# Patient Record
Sex: Female | Born: 1960 | Race: White | Hispanic: No | Marital: Single | Smoking: Current every day smoker
Health system: Southern US, Community
[De-identification: ages and names within clinical notes are randomized; demographics above are authoritative.]

## PROBLEM LIST (undated history)

## (undated) DIAGNOSIS — J449 Chronic obstructive pulmonary disease, unspecified: Secondary | ICD-10-CM

## (undated) DIAGNOSIS — I890 Lymphedema, not elsewhere classified: Secondary | ICD-10-CM

---

## 2016-08-03 ENCOUNTER — Emergency Department (HOSPITAL_COMMUNITY)
Admission: EM | Admit: 2016-08-03 | Discharge: 2016-08-04 | Disposition: A | Discharge status: Home / Self Care | Payer: Self-pay | Attending: Emergency Medicine | Admitting: Emergency Medicine

## 2016-08-03 ENCOUNTER — Encounter (HOSPITAL_COMMUNITY): Payer: Self-pay | Admitting: Emergency Medicine

## 2016-08-03 ENCOUNTER — Emergency Department (HOSPITAL_COMMUNITY): Payer: Self-pay

## 2016-08-03 DIAGNOSIS — Y939 Activity, unspecified: Secondary | ICD-10-CM | POA: Insufficient documentation

## 2016-08-03 DIAGNOSIS — Y999 Unspecified external cause status: Secondary | ICD-10-CM | POA: Insufficient documentation

## 2016-08-03 DIAGNOSIS — S52502A Unspecified fracture of the lower end of left radius, initial encounter for closed fracture: Secondary | ICD-10-CM | POA: Insufficient documentation

## 2016-08-03 DIAGNOSIS — Y92009 Unspecified place in unspecified non-institutional (private) residence as the place of occurrence of the external cause: Secondary | ICD-10-CM | POA: Insufficient documentation

## 2016-08-03 DIAGNOSIS — S52612A Displaced fracture of left ulna styloid process, initial encounter for closed fracture: Secondary | ICD-10-CM | POA: Insufficient documentation

## 2016-08-03 DIAGNOSIS — W010XXA Fall on same level from slipping, tripping and stumbling without subsequent striking against object, initial encounter: Secondary | ICD-10-CM | POA: Insufficient documentation

## 2016-08-03 DIAGNOSIS — F17200 Nicotine dependence, unspecified, uncomplicated: Secondary | ICD-10-CM | POA: Insufficient documentation

## 2016-08-03 DIAGNOSIS — S52602A Unspecified fracture of lower end of left ulna, initial encounter for closed fracture: Secondary | ICD-10-CM

## 2016-08-03 MED ORDER — HYDROMORPHONE HCL 1 MG/ML IJ SOLN
1.0000 mg | Freq: Once | INTRAMUSCULAR | Status: AC
  Administered 2016-08-03: 1 mg via INTRAVENOUS
  Filled 2016-08-03: qty 1

## 2016-08-03 MED ORDER — NICOTINE 14 MG/24HR TD PT24
14.0000 mg | MEDICATED_PATCH | Freq: Once | TRANSDERMAL | Status: DC
Start: 2016-08-03 — End: 2016-08-04
  Administered 2016-08-03: 14 mg via TRANSDERMAL
  Filled 2016-08-03: qty 1

## 2016-08-03 NOTE — ED Triage Notes (Signed)
Pt tripped over toy and injured left wrist, obvious deformity.

## 2016-08-03 NOTE — ED Provider Notes (Signed)
[facility] DEPT Provider Note   CSN: [id number] Arrival date & time: 08/03/16  2217     History   Chief Complaint Chief Complaint  Patient presents with  . Wrist Pain    HPI Margaret Weber is a 56 y.o. female.  Patient reports a mechanical fall at home tripping over a cat Toy injuring her left wrist. States she fell off to the side and landed on her left wrist. She was not able to get up and laid on the floor for 3 hours until her husband got home. She denies any head or neck pain. She denies any back pain. She denies any other injury other than her wrist. No other medical problems. Does not take any medications. No focal weakness, numbness or tingling.   The history is provided by the patient.  Wrist Pain  Pertinent negatives include no chest pain, no abdominal pain, no headaches and no shortness of breath.    History reviewed. No pertinent past medical history.  There are no active problems to display for this patient.   History reviewed. No pertinent surgical history.  OB History    No data available       Home Medications    Prior to Admission medications   Not on File    Family History History reviewed. No pertinent family history.  Social History Social History  Substance Use Topics  . Smoking status: Current Every Day Smoker  . Smokeless tobacco: Never Used  . Alcohol use No     Allergies   Patient has no allergy information on record.   Review of Systems Review of Systems  Constitutional: Negative for activity change, appetite change and fever.  HENT: Negative for congestion.   Respiratory: Negative for cough, chest tightness and shortness of breath.   Cardiovascular: Negative for chest pain.  Gastrointestinal: Negative for abdominal pain, nausea and vomiting.  Genitourinary: Negative for dysuria, hematuria, vaginal bleeding and vaginal discharge.  Musculoskeletal: Positive for arthralgias and myalgias. Negative for back pain and neck  pain.  Neurological: Negative for dizziness, weakness and headaches.   all other systems are negative except as noted in the HPI and PMH.    Physical Exam Updated Vital Signs BP (!) 174/95   Pulse (!) 115   Temp 98.1 F (36.7 C)   Resp 20   Ht 5\' 4"  (1.626 m)   Wt 90.7 kg (200 lb)   SpO2 94%   BMI 34.33 kg/m   Physical Exam  Constitutional: She is oriented to person, place, and time. She appears well-developed and well-nourished. No distress.  HENT:  Head: Normocephalic and atraumatic.  Mouth/Throat: Oropharynx is clear and moist. No oropharyngeal exudate.  Eyes: Conjunctivae and EOM are normal. Pupils are equal, round, and reactive to light.  Neck: Normal range of motion. Neck supple.  No C-spine tenderness  Cardiovascular: Normal rate, regular rhythm, normal heart sounds and intact distal pulses.   No murmur heard. Pulmonary/Chest: Effort normal and breath sounds normal. No respiratory distress.  Abdominal: Soft. There is no tenderness. There is no rebound and no guarding.  Musculoskeletal: Normal range of motion. She exhibits edema, tenderness and deformity.  Deformity and swelling to left wrist. Intact distal pulse. Range of motion of fingers intact, wounds, full range of motion of shoulder and elbow  Neurological: She is alert and oriented to person, place, and time. No cranial nerve deficit. She exhibits normal muscle tone. Coordination normal.   5/5 strength throughout. CN 2-12 intact.Equal grip strength.  Skin: Skin is warm.  Psychiatric: She has a normal mood and affect. Her behavior is normal.  Nursing note and vitals reviewed.    ED Treatments / Results  Labs (all labs ordered are listed, but only abnormal results are displayed) Labs Reviewed - No data to display  EKG  EKG Interpretation None       Radiology Dg Wrist Complete Left  Result Date: 08/04/2016 CLINICAL DATA:  Fracture, postreduction. EXAM: LEFT WRIST - COMPLETE 3+ VIEW COMPARISON:  Pre  reduction exam earlier this day. FINDINGS: Improved alignment of comminuted angulated distal radius fracture with mild persistent apex volar angulation. Unchanged alignment of ulna styloid fracture. No new abnormality is seen. Overlying splint material limits osseous and soft tissue fine detail. IMPRESSION: Improved alignment of comminuted angulated distal radius fracture postreduction. Unchanged ulna styloid fracture. Electronically Signed   By: Rubye Oaks M.D.   On: 08/04/2016 03:23   Dg Wrist Complete Left  Result Date: 08/04/2016 CLINICAL DATA:  Trip over a toy and fall. Left wrist pain and deformity. EXAM: LEFT WRIST - COMPLETE 3+ VIEW COMPARISON:  None. FINDINGS: Displaced angulated fracture of the distal radial metaphysis with apex volar angulation. Fracture extends into the distal radioulnar and radiocarpal joints. Displaced ulna styloid fracture. Carpals remain aligned with dominant distal radial fracture fragment. Diffuse soft tissue edema. IMPRESSION: Displaced angulated distal radial fracture extending into the radiocarpal and distal radioulnar joints. Displaced ulna styloid fracture. Electronically Signed   By: Rubye Oaks M.D.   On: 08/04/2016 01:14    Procedures .Foreign Body Removal Date/Time: 08/04/2016 12:30 AM Performed by: Glynn Octave Authorized by: Glynn Octave  Consent: The procedure was performed in an emergent situation. Verbal consent obtained. Risks and benefits: risks, benefits and alternatives were discussed Consent given by: patient Patient identity confirmed: verbally with patient Time out: Immediately prior to procedure a "time out" was called to verify the correct patient, procedure, equipment, support staff and site/side marked as required. Body area: skin General location: upper extremity Location details: left ring finger  Sedation: Patient sedated: no Patient restrained: no Patient cooperative: yes Depth: subcutaneous Complexity:  simple 1 objects recovered. Objects recovered: ring Post-procedure assessment: foreign body removed Patient tolerance: Patient tolerated the procedure well with no immediate complications Reduction of fracture Date/Time: 08/04/2016 2:33 AM Performed by: Glynn Octave Authorized by: Glynn Octave  Consent: Written consent obtained. Risks and benefits: risks, benefits and alternatives were discussed Consent given by: patient Patient understanding: patient states understanding of the procedure being performed Patient consent: the patient's understanding of the procedure matches consent given Procedure consent: procedure consent matches procedure scheduled Relevant documents: relevant documents present and verified Test results: test results available and properly labeled Site marked: the operative site was marked Imaging studies: imaging studies available Patient identity confirmed: verbally with patient and provided demographic data Time out: Immediately prior to procedure a "time out" was called to verify the correct patient, procedure, equipment, support staff and site/side marked as required. Local anesthesia used: no  Anesthesia: Local anesthesia used: no  Sedation: Patient sedated: yes Sedation type: moderate (conscious) sedation Sedatives: propofol Analgesia: hydromorphone Sedation start date/time: 08/04/2016 2:20 AM Sedation end date/time: 08/04/2016 2:33 AM Vitals: Vital signs were monitored during sedation. Patient tolerance: Patient tolerated the procedure well with no immediate complications    (including critical care time)  Medications Ordered in ED Medications  HYDROmorphone (DILAUDID) injection 1 mg (not administered)  nicotine (NICODERM CQ - dosed in mg/24 hours) patch 14 mg (not administered)  Initial Impression / Assessment and Plan / ED Course  I have reviewed the triage vital signs and the nursing notes.  Pertinent labs & imaging results that were  available during my care of the patient were reviewed by me and considered in my medical decision making (see chart for details).   mechanical fall with apparent left wrist fracture. No other injuries. Neurovascularly intact.  X-ray confirms displaced and angulated distal radius fracture. Discussed with Dr. Romeo AppleHarrison of orthopedics who agrees with sedation and reduction in the ED. He will see the patient tomorrow.  Sedation performed and wrist reduced as above. Postreduction x-ray shows improved alignment of radius fracture with minimal dorsal angulation persisting. Neurovascularly intact after reduction. Patient splinted.  Patient will follow-up with Dr. Romeo AppleHarrison. Pain control given. Return precautions discussed.  Procedural sedation Performed by: Glynn OctaveANCOUR, Twilia Yaklin Consent: Verbal consent obtained. Risks and benefits: risks, benefits and alternatives were discussed Required items: required blood products, implants, devices, and special equipment available Patient identity confirmed: arm band and provided demographic data Time out: Immediately prior to procedure a "time out" was called to verify the correct patient, procedure, equipment, support staff and site/side marked as required.  Sedation type: moderate (conscious) sedation NPO time confirmed and considedered  Sedatives: PROPOFOL  Physician Time at Bedside: 15  Vitals: Vital signs were monitored during sedation. Cardiac Monitor, pulse oximeter Patient tolerance: Patient tolerated the procedure well with no immediate complications. Comments: Pt with uneventful recovered. Returned to pre-procedural sedation baseline  SPLINT APPLICATION Date/Time: 6:28 AM Authorized by: Glynn OctaveANCOUR, Regnia Mathwig Consent: Verbal consent obtained. Risks and benefits: risks, benefits and alternatives were discussed Consent given by: patient Splint applied by: myself Location details: L wrist Splint type: sugartong Supplies used: fiberglass Post-procedure:  The splinted body part was neurovascularly unchanged following the procedure. Patient tolerance: Patient tolerated the procedure well with no immediate complications.     Final Clinical Impressions(s) / ED Diagnoses   Final diagnoses:  Closed fracture of distal end of left radius with ulna, initial encounter    New Prescriptions New Prescriptions   No medications on file     Glynn Octaveancour, Dekayla Prestridge, MD 08/04/16 [phone]

## 2016-08-04 ENCOUNTER — Emergency Department (HOSPITAL_COMMUNITY): Payer: Self-pay

## 2016-08-04 MED ORDER — ONDANSETRON HCL 4 MG/2ML IJ SOLN
4.0000 mg | Freq: Once | INTRAMUSCULAR | Status: AC
  Administered 2016-08-04: 4 mg via INTRAVENOUS

## 2016-08-04 MED ORDER — ONDANSETRON HCL 4 MG/2ML IJ SOLN
INTRAMUSCULAR | Status: AC
  Administered 2016-08-04: 4 mg via INTRAVENOUS
  Filled 2016-08-04: qty 2

## 2016-08-04 MED ORDER — PROPOFOL 10 MG/ML IV BOLUS
1.0000 mg/kg | Freq: Once | INTRAVENOUS | Status: AC
  Administered 2016-08-04: 50 mg via INTRAVENOUS
  Filled 2016-08-04: qty 20

## 2016-08-04 MED ORDER — HYDROMORPHONE HCL 1 MG/ML IJ SOLN
1.0000 mg | Freq: Once | INTRAMUSCULAR | Status: AC
Start: 2016-08-04 — End: 2016-08-04
  Administered 2016-08-04: 1 mg via INTRAVENOUS
  Filled 2016-08-04: qty 1

## 2016-08-04 MED ORDER — OXYCODONE-ACETAMINOPHEN 5-325 MG PO TABS: 1.0000 | ORAL_TABLET | ORAL | 0 refills | Status: DC | PRN

## 2016-08-04 NOTE — ED Notes (Signed)
Pt a&o x 4, vs stable, RX/verbal/written discharge instructions given to pt and sign other, verbalized understanding

## 2016-08-04 NOTE — Discharge Instructions (Signed)
Take the pain medication as prescribed and keep the arm elevated. Follow up with Dr. Romeo AppleHarrison. Return to the ED if you develop worsening pain, weakness, numbness, tingling, or any other concerns.

## 2020-01-28 ENCOUNTER — Emergency Department (HOSPITAL_COMMUNITY): Payer: HRSA Program

## 2020-01-28 ENCOUNTER — Encounter (HOSPITAL_COMMUNITY): Payer: Self-pay

## 2020-01-28 ENCOUNTER — Inpatient Hospital Stay (HOSPITAL_COMMUNITY)
Admission: EM | Admit: 2020-01-28 | Discharge: 2020-02-02 | DRG: 177 | Disposition: A | Discharge status: Home Health | Payer: HRSA Program | Attending: Internal Medicine | Admitting: Internal Medicine

## 2020-01-28 ENCOUNTER — Other Ambulatory Visit: Payer: Self-pay

## 2020-01-28 DIAGNOSIS — I878 Other specified disorders of veins: Secondary | ICD-10-CM | POA: Diagnosis present

## 2020-01-28 DIAGNOSIS — R600 Localized edema: Secondary | ICD-10-CM | POA: Diagnosis not present

## 2020-01-28 DIAGNOSIS — R7303 Prediabetes: Secondary | ICD-10-CM | POA: Diagnosis present

## 2020-01-28 DIAGNOSIS — F17210 Nicotine dependence, cigarettes, uncomplicated: Secondary | ICD-10-CM | POA: Diagnosis present

## 2020-01-28 DIAGNOSIS — J90 Pleural effusion, not elsewhere classified: Secondary | ICD-10-CM | POA: Diagnosis present

## 2020-01-28 DIAGNOSIS — U071 COVID-19: Principal | ICD-10-CM | POA: Diagnosis present

## 2020-01-28 DIAGNOSIS — G4700 Insomnia, unspecified: Secondary | ICD-10-CM | POA: Diagnosis present

## 2020-01-28 DIAGNOSIS — Z720 Tobacco use: Secondary | ICD-10-CM | POA: Diagnosis not present

## 2020-01-28 DIAGNOSIS — Q800 Ichthyosis vulgaris: Secondary | ICD-10-CM | POA: Diagnosis not present

## 2020-01-28 DIAGNOSIS — Z6841 Body Mass Index (BMI) 40.0 and over, adult: Secondary | ICD-10-CM | POA: Diagnosis not present

## 2020-01-28 DIAGNOSIS — I3139 Other pericardial effusion (noninflammatory): Secondary | ICD-10-CM | POA: Diagnosis present

## 2020-01-28 DIAGNOSIS — J440 Chronic obstructive pulmonary disease with acute lower respiratory infection: Secondary | ICD-10-CM | POA: Diagnosis present

## 2020-01-28 DIAGNOSIS — J441 Chronic obstructive pulmonary disease with (acute) exacerbation: Secondary | ICD-10-CM

## 2020-01-28 DIAGNOSIS — J9601 Acute respiratory failure with hypoxia: Secondary | ICD-10-CM | POA: Diagnosis present

## 2020-01-28 DIAGNOSIS — R0902 Hypoxemia: Secondary | ICD-10-CM | POA: Diagnosis present

## 2020-01-28 DIAGNOSIS — R0602 Shortness of breath: Secondary | ICD-10-CM | POA: Diagnosis not present

## 2020-01-28 DIAGNOSIS — J1282 Pneumonia due to coronavirus disease 2019: Secondary | ICD-10-CM | POA: Diagnosis present

## 2020-01-28 DIAGNOSIS — R4689 Other symptoms and signs involving appearance and behavior: Secondary | ICD-10-CM | POA: Diagnosis present

## 2020-01-28 DIAGNOSIS — R7302 Impaired glucose tolerance (oral): Secondary | ICD-10-CM | POA: Diagnosis present

## 2020-01-28 DIAGNOSIS — I313 Pericardial effusion (noninflammatory): Secondary | ICD-10-CM | POA: Diagnosis present

## 2020-01-28 DIAGNOSIS — M79604 Pain in right leg: Secondary | ICD-10-CM

## 2020-01-28 DIAGNOSIS — E11628 Type 2 diabetes mellitus with other skin complications: Secondary | ICD-10-CM | POA: Diagnosis present

## 2020-01-28 LAB — COMPREHENSIVE METABOLIC PANEL
ALT: 25 U/L (ref 0–44)
AST: 20 U/L (ref 15–41)
Albumin: 4.1 g/dL (ref 3.5–5.0)
Alkaline Phosphatase: 71 U/L (ref 38–126)
Anion gap: 10 (ref 5–15)
BUN: 11 mg/dL (ref 6–20)
CO2: 27 mmol/L (ref 22–32)
Calcium: 9 mg/dL (ref 8.9–10.3)
Chloride: 99 mmol/L (ref 98–111)
Creatinine, Ser: 0.82 mg/dL (ref 0.44–1.00)
GFR, Estimated: >60 mL/min (ref >60)
Glucose, Bld: 114 mg/dL — ABNORMAL HIGH (ref 70–99)
Potassium: 3.9 mmol/L (ref 3.5–5.1)
Sodium: 136 mmol/L (ref 135–145)
Total Bilirubin: 0.4 mg/dL (ref 0.3–1.2)
Total Protein: 8 g/dL (ref 6.5–8.1)

## 2020-01-28 LAB — CBC WITH DIFFERENTIAL/PLATELET
Abs Immature Granulocytes: 0.02 10*3/uL (ref 0.00–0.07)
Basophils Absolute: 0 10*3/uL (ref 0.0–0.1)
Basophils Relative: 1 %
Eosinophils Absolute: 0 10*3/uL (ref 0.0–0.5)
Eosinophils Relative: 0 %
HCT: 44.7 % (ref 36.0–46.0)
Hemoglobin: 14.1 g/dL (ref 12.0–15.0)
Immature Granulocytes: 0 %
Lymphocytes Relative: 13 %
Lymphs Abs: 0.8 10*3/uL (ref 0.7–4.0)
MCH: 31.3 pg (ref 26.0–34.0)
MCHC: 31.5 g/dL (ref 30.0–36.0)
MCV: 99.3 fL (ref 80.0–100.0)
Monocytes Absolute: 1 10*3/uL (ref 0.1–1.0)
Monocytes Relative: 16 %
Neutro Abs: 4.5 10*3/uL (ref 1.7–7.7)
Neutrophils Relative %: 70 %
Platelets: 248 10*3/uL (ref 150–400)
RBC: 4.5 MIL/uL (ref 3.87–5.11)
RDW: 14.6 % (ref 11.5–15.5)
WBC: 6.3 10*3/uL (ref 4.0–10.5)
nRBC: 0 % (ref 0.0–0.2)

## 2020-01-28 LAB — LACTIC ACID, PLASMA
Lactic Acid, Venous: 1.2 mmol/L (ref 0.5–1.9)
Lactic Acid, Venous: 1.2 mmol/L (ref 0.5–1.9)

## 2020-01-28 LAB — D-DIMER, QUANTITATIVE: D-Dimer, Quant: 0.89 ug/mL-FEU — ABNORMAL HIGH (ref 0.00–0.50)

## 2020-01-28 LAB — RESP PANEL BY RT-PCR (FLU A&B, COVID) ARPGX2
Influenza A by PCR: NEGATIVE
Influenza B by PCR: NEGATIVE
SARS Coronavirus 2 by RT PCR: POSITIVE — AB

## 2020-01-28 LAB — FERRITIN: Ferritin: 55 ng/mL (ref 11–307)

## 2020-01-28 LAB — LACTATE DEHYDROGENASE: LDH: 118 U/L (ref 98–192)

## 2020-01-28 LAB — TRIGLYCERIDES: Triglycerides: 145 mg/dL (ref <150)

## 2020-01-28 LAB — CBG MONITORING, ED: Glucose-Capillary: 138 mg/dL — ABNORMAL HIGH (ref 70–99)

## 2020-01-28 LAB — PROCALCITONIN: Procalcitonin: <0.1 ng/mL

## 2020-01-28 LAB — TROPONIN I (HIGH SENSITIVITY)
Troponin I (High Sensitivity): 4 ng/L (ref <18)
Troponin I (High Sensitivity): 5 ng/L (ref <18)

## 2020-01-28 LAB — BRAIN NATRIURETIC PEPTIDE: B Natriuretic Peptide: 24 pg/mL (ref 0.0–100.0)

## 2020-01-28 LAB — FIBRINOGEN: Fibrinogen: 545 mg/dL — ABNORMAL HIGH (ref 210–475)

## 2020-01-28 LAB — C-REACTIVE PROTEIN: CRP: 2.3 mg/dL — ABNORMAL HIGH (ref <1.0)

## 2020-01-28 MED ORDER — SODIUM CHLORIDE 0.9 % IV SOLN: 100.0000 mg | Freq: Every day | INTRAVENOUS | Status: DC

## 2020-01-28 MED ORDER — ASCORBIC ACID 500 MG PO TABS
500.0000 mg | ORAL_TABLET | Freq: Every day | ORAL | Status: DC
  Administered 2020-01-28 – 2020-02-02 (×6): 500 mg via ORAL
  Filled 2020-01-28 (×7): qty 1

## 2020-01-28 MED ORDER — SODIUM CHLORIDE 0.9% FLUSH
3.0000 mL | Freq: Two times a day (BID) | INTRAVENOUS | Status: DC
  Administered 2020-01-29 – 2020-02-02 (×9): 3 mL via INTRAVENOUS

## 2020-01-28 MED ORDER — PREDNISONE 20 MG PO TABS
50.0000 mg | ORAL_TABLET | Freq: Every day | ORAL | Status: DC
  Administered 2020-01-31 – 2020-02-01 (×2): 50 mg via ORAL
  Filled 2020-01-28 (×2): qty 1

## 2020-01-28 MED ORDER — ONDANSETRON HCL 4 MG/2ML IJ SOLN: 4.0000 mg | Freq: Four times a day (QID) | INTRAMUSCULAR | Status: DC | PRN

## 2020-01-28 MED ORDER — HYDROCOD POLST-CPM POLST ER 10-8 MG/5ML PO SUER: 5.0000 mL | Freq: Two times a day (BID) | ORAL | Status: DC | PRN

## 2020-01-28 MED ORDER — GUAIFENESIN-DM 100-10 MG/5ML PO SYRP: 10.0000 mL | ORAL_SOLUTION | ORAL | Status: DC | PRN

## 2020-01-28 MED ORDER — ONDANSETRON HCL 4 MG PO TABS: 4.0000 mg | ORAL_TABLET | Freq: Four times a day (QID) | ORAL | Status: DC | PRN

## 2020-01-28 MED ORDER — SODIUM CHLORIDE 0.9 % IV SOLN: 250.0000 mL | INTRAVENOUS | Status: DC | PRN

## 2020-01-28 MED ORDER — SODIUM CHLORIDE 0.9 % IV SOLN: 200.0000 mg | Freq: Once | INTRAVENOUS | Status: DC

## 2020-01-28 MED ORDER — SODIUM CHLORIDE 0.9 % IV SOLN
100.0000 mg | INTRAVENOUS | Status: AC
  Administered 2020-01-28: 100 mg via INTRAVENOUS
  Filled 2020-01-28 (×2): qty 20

## 2020-01-28 MED ORDER — ALBUTEROL SULFATE HFA 108 (90 BASE) MCG/ACT IN AERS: 1.0000 | INHALATION_SPRAY | RESPIRATORY_TRACT | Status: DC | PRN

## 2020-01-28 MED ORDER — NICOTINE 21 MG/24HR TD PT24
21.0000 mg | MEDICATED_PATCH | Freq: Every day | TRANSDERMAL | Status: DC
  Administered 2020-01-28 – 2020-02-02 (×6): 21 mg via TRANSDERMAL
  Filled 2020-01-28 (×6): qty 1

## 2020-01-28 MED ORDER — BISACODYL 5 MG PO TBEC: 5.0000 mg | DELAYED_RELEASE_TABLET | Freq: Every day | ORAL | Status: DC | PRN

## 2020-01-28 MED ORDER — ZINC SULFATE 220 (50 ZN) MG PO CAPS
220.0000 mg | ORAL_CAPSULE | Freq: Every day | ORAL | Status: DC
  Administered 2020-01-28 – 2020-02-02 (×6): 220 mg via ORAL
  Filled 2020-01-28 (×6): qty 1

## 2020-01-28 MED ORDER — ACETAMINOPHEN 325 MG PO TABS
650.0000 mg | ORAL_TABLET | Freq: Four times a day (QID) | ORAL | Status: DC | PRN
  Administered 2020-01-29 – 2020-01-30 (×2): 650 mg via ORAL
  Filled 2020-01-28 (×2): qty 2

## 2020-01-28 MED ORDER — INSULIN ASPART 100 UNIT/ML ~~LOC~~ SOLN
0.0000 [IU] | Freq: Three times a day (TID) | SUBCUTANEOUS | Status: DC
  Administered 2020-01-29: 2 [IU] via SUBCUTANEOUS
  Administered 2020-01-29 – 2020-01-30 (×4): 1 [IU] via SUBCUTANEOUS
  Administered 2020-01-31 – 2020-02-01 (×3): 2 [IU] via SUBCUTANEOUS
  Administered 2020-02-01 – 2020-02-02 (×2): 3 [IU] via SUBCUTANEOUS
  Administered 2020-02-02 (×2): 2 [IU] via SUBCUTANEOUS
  Filled 2020-01-28: qty 1

## 2020-01-28 MED ORDER — ENOXAPARIN SODIUM 40 MG/0.4ML ~~LOC~~ SOLN
40.0000 mg | SUBCUTANEOUS | Status: DC
  Administered 2020-01-28: 40 mg via SUBCUTANEOUS
  Filled 2020-01-28: qty 0.4

## 2020-01-28 MED ORDER — METHYLPREDNISOLONE SODIUM SUCC 125 MG IJ SOLR
0.5000 mg/kg | Freq: Two times a day (BID) | INTRAMUSCULAR | Status: AC
  Administered 2020-01-28 – 2020-01-30 (×5): 56.875 mg via INTRAVENOUS
  Filled 2020-01-28 (×5): qty 2

## 2020-01-28 MED ORDER — SODIUM CHLORIDE 0.9 % IV SOLN
100.0000 mg | Freq: Every day | INTRAVENOUS | Status: AC
  Administered 2020-01-28 – 2020-02-01 (×5): 100 mg via INTRAVENOUS
  Filled 2020-01-28 (×4): qty 20

## 2020-01-28 MED ORDER — SODIUM CHLORIDE 0.9% FLUSH: 3.0000 mL | INTRAVENOUS | Status: DC | PRN

## 2020-01-28 NOTE — ED Triage Notes (Signed)
Pt called out to EMS for a new onset of generalized weakness. NAD noted.

## 2020-01-28 NOTE — ED Notes (Signed)
Patient given graham crackers and diet gingerale.

## 2020-01-28 NOTE — ED Notes (Addendum)
Pt oxygen 88% on RA, placed on 3L now at 96%

## 2020-01-28 NOTE — H&P (Signed)
History and Physical    Margaret Weber IOE:[id number] DOB: 02-04-1961 DOA: 01/28/2020  I have briefly reviewed the patient's prior medical records in [facility]  PCP: Patient, No Pcp Per  Patient coming from: home  Chief Complaint: Shortness of breath  HPI: Margaret Weber is a 59 y.o. female with medical history significant of morbid obesity, ongoing tobacco abuse, poor mobility and essentially bedbound over the last year, chronic lower extremity swelling, does not see doctor regularly so no defined medical problems, on no medications at home comes to the hospital with complaints of shortness of breath.  Patient lives with her fianc and he had a URI couple weeks ago.  Ever since, patient also felt URI type symptoms, initially started as a mild sore throat but then progressed to muscle aches, generalized fatigue and eventually shortness of breath over the last couple of days.  She denies any loss of taste or smell.  She reports chills occasionally.  No abdominal pain, but she did have nausea and vomiting.  No diarrhea. She has been complaining of a cough but this is somewhat chronic for her.  ED Course: In the ED she is afebrile 98.6, tachypneic with respiratory rate 23, normotensive.  She was satting in the mid 80s on room air requiring 3 L supplemental oxygen.  Blood work shows normal CMP with a glucose of 114, BNP is normal, high-sensitivity troponin normal, CRP mildly elevated 2.3, negative procalcitonin and lactic acid of 1.2.  CBC is unremarkable.  Chest x-ray with small pleural effusion left base atelectasis.  She tested positive for COVID-19, and due to hypoxia we are asked to admit  Review of Systems: All systems reviewed, and apart from HPI, all negative  History reviewed. No pertinent past medical history.  History reviewed. No pertinent surgical history.   reports that she has been smoking cigarettes. She has been smoking about 1.50 packs per day. She has never used smokeless  tobacco. She reports that she does not drink alcohol and does not use drugs.  Not on File  Family history reviewed, noncontributory  Prior to Admission medications   Medication Sig Start Date End Date Taking? Authorizing Provider  oxyCODONE-acetaminophen (PERCOCET/ROXICET) 5-325 MG tablet Take 1 tablet by mouth every 4 (four) hours as needed for severe pain. 08/04/16  Yes Rancour, Jeannett Senior, MD    Physical Exam: Vitals:   01/28/20 1245 01/28/20 1330 01/28/20 1400 01/28/20 1430  BP:  106/71 (!) 112/58 (!) 100/59  Pulse: 98 (!) 103 (!) 103 97  Resp: 17 (!) 24 (!) 23 18  Temp:      TempSrc:      SpO2: 93% 96%  95%  Weight:      Height:       Constitutional: NAD, calm, comfortable Eyes: PERRL, lids and conjunctivae normal ENMT: Mucous membranes are moist. Posterior pharynx clear of any exudate or lesions.Normal dentition.  Neck: normal, supple Respiratory: Exam difficult due to obesity but overall clear on anterior auscultation, tachypneic at times, moves air well.  Rhonchorous breath sounds with coughing Cardiovascular: Regular rate and rhythm, no murmurs / rubs / gallops.  1+ lower extremity edema Abdomen: no tenderness, no masses palpated. Bowel sounds positive.  Musculoskeletal: no clubbing / cyanosis. Normal muscle tone.  Skin: Chronic venous stasis changes bilateral lower extremity, dry hardened skin bilateral feet, no other rashes Neurologic: CN 2-12 grossly intact. Strength 5/5 in all 4.  Psychiatric: Normal judgment and insight. Alert and oriented x 3. Normal mood.   Labs on  Admission: I have personally reviewed following labs and imaging studies  CBC: Recent Labs  Lab 01/28/20 1434  WBC 6.3  NEUTROABS 4.5  HGB 14.1  HCT 44.7  MCV 99.3  PLT 248   Basic Metabolic Panel: Recent Labs  Lab 01/28/20 1434  NA 136  K 3.9  CL 99  CO2 27  GLUCOSE 114*  BUN 11  CREATININE 0.82  CALCIUM 9.0   Liver Function Tests: Recent Labs  Lab 01/28/20 1434  AST 20  ALT 25   ALKPHOS 71  BILITOT 0.4  PROT 8.0  ALBUMIN 4.1   Coagulation Profile: No results for input(s): INR, PROTIME in the last 168 hours. BNP (last 3 results) No results for input(s): PROBNP in the last 8760 hours. CBG: No results for input(s): GLUCAP in the last 168 hours. Thyroid Function Tests: No results for input(s): TSH, T4TOTAL, FREET4, T3FREE, THYROIDAB in the last 72 hours. Urine analysis: No results found for: COLORURINE, APPEARANCEUR, LABSPEC, PHURINE, GLUCOSEU, HGBUR, BILIRUBINUR, KETONESUR, PROTEINUR, UROBILINOGEN, NITRITE, LEUKOCYTESUR   Radiological Exams on Admission: DG Chest Portable 1 View  Result Date: 01/28/2020 CLINICAL DATA:  Shortness of breath EXAM: PORTABLE CHEST 1 VIEW COMPARISON:  None. FINDINGS: There is a small left pleural effusion. There is atelectatic change in the left base. There is no appreciable edema or consolidation. Relative increased opacity in the right base region is felt to be due to overlying breast tissue. Heart is upper normal in size with pulmonary vascularity normal. No adenopathy. No bone lesions. IMPRESSION: Small left pleural effusion with left base atelectasis. No edema or airspace consolidation. Heart upper normal in size. Electronically Signed   By: Bretta Bang III M.D.   On: 01/28/2020 13:06    EKG: Independently reviewed.  Sinus rhythm  Assessment/Plan  Principal Problem Acute hypoxic respiratory failure due to COVID-19 viral pneumonia -Admit to the hospital, provide remdesivir, steroids.  Currently on 3 L, discussed with patient that if her oxygenation is getting worse she may be a candidate for baricitinib, patient consented for that if needed later on. -Incentive spirometry, flutter valve, proning as able  Active Problems Immobility, deconditioning -Get PT consult  Morbid obesity -Patient would benefit from weight loss  Tobacco abuse -Provide nicotine patch  Chronic venous stasis, chronic lower extremity  edema -Suspect diastolic dysfunction   DVT prophylaxis: Lovenox  Code Status: Full code  Family Communication: No family at bedside Disposition Plan: Home when ready Bed Type: Telemetry Consults called: None Obs/Inp: Inpatient  At the time of admission, it appears that the appropriate admission status for this patient is INPATIENT as it is expected that patient will require hospital care > 2 midnights. This is judged to be reasonable and necessary in order to provide the required intensity of service to ensure the patient's safety given: presenting symptoms, initial radiographic and laboratory data and in the context of their chronic comorbidities. Together, these circumstances are felt to place patient at high at high risk for further clinical deterioration threatening life, limb, or organ.  Pamella Pert, MD, PhD [facility]  Contact via [email or web address]  01/28/2020, 4:29 PM

## 2020-01-28 NOTE — ED Provider Notes (Signed)
[facility] EMERGENCY DEPARTMENT Provider Note   CSN: [id number] Arrival date & time: 01/28/20  1159     History Chief Complaint  Patient presents with  . Weakness    Margaret Weber is a 59 y.o. female presenting for evaluation of weakness.  Patient states she has been weak for years, but this is been worse over the past several days.  She not want to come to the hospital, but her fianc forced her to.  She states she is weak all the time, this is worse with exertion.  She reports a chronic cough which is unchanged, but then states she has a new rattle in her chest.  She reports x1 episodes of emesis today, but states that was due to nerves. Emesis was nonbloody, nonbilious. She denies fevers, chills, chest pain, shortness of breath, abdominal pain, urinary symptoms, abnormal bowel movements.  She denies change in appetite.  She has no medical problems and takes medications daily, however she does not see a regular doctor and has not been evaluated for many years.  HPI     History reviewed. No pertinent past medical history.  Patient Active Problem List   Diagnosis Date Noted  . Acute hypoxemic respiratory failure due to COVID-19 [facility]) 01/28/2020    History reviewed. No pertinent surgical history.   OB History   No obstetric history on file.     No family history on file.  Social History   Tobacco Use  . Smoking status: Current Every Day Smoker    Packs/day: 1.50    Types: Cigarettes  . Smokeless tobacco: Never Used  Substance Use Topics  . Alcohol use: No  . Drug use: No    Home Medications Prior to Admission medications   Medication Sig Start Date End Date Taking? Authorizing Provider  oxyCODONE-acetaminophen (PERCOCET/ROXICET) 5-325 MG tablet Take 1 tablet by mouth every 4 (four) hours as needed for severe pain. 08/04/16  Yes Rancour, Jeannett Senior, MD    Allergies    Patient has no allergy information on record.  Review of Systems   Review of Systems   Gastrointestinal: Positive for nausea (x1).  Neurological: Positive for weakness.  All other systems reviewed and are negative.   Physical Exam Updated Vital Signs BP (!) 100/59   Pulse 97   Temp 98.6 F (37 C)   Resp 18   Ht 5\' 4"  (1.626 m)   Wt 113.4 kg   SpO2 95%   BMI 42.91 kg/m   Physical Exam Vitals and nursing note reviewed.  Constitutional:      General: She is not in acute distress.    Appearance: She is well-developed. She is obese.     Comments: Appears chronically ill, in NAD  HENT:     Head: Normocephalic and atraumatic.  Eyes:     Extraocular Movements: Extraocular movements intact.     Conjunctiva/sclera: Conjunctivae normal.     Pupils: Pupils are equal, round, and reactive to light.  Cardiovascular:     Rate and Rhythm: Normal rate and regular rhythm.     Pulses: Normal pulses.     Comments: Pt became tachycardic with sitting up in bed Pulmonary:     Effort: Pulmonary effort is normal. No respiratory distress.     Breath sounds: Rales present. No wheezing.     Comments: Scattered rales.  Wet sounding cough noted on exam.  SPO2 in the low 90s on room air.  Speaking in full sentences.  Pt more visibly more short of  breath when asked to sit up in bed Abdominal:     General: There is no distension.     Palpations: Abdomen is soft. There is no mass.     Tenderness: There is no abdominal tenderness. There is no guarding or rebound.  Musculoskeletal:        General: Normal range of motion.     Cervical back: Normal range of motion and neck supple.     Right lower leg: Edema present.     Left lower leg: Edema present.     Comments: Bilateral lower leg swelling with chronic skin change/thickening, c/w woody edema.  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  Pedal pulses unable to be palpated due to skin changes.  Skin:    General: Skin is warm and dry.     Capillary Refill: Capillary refill takes less than 2 seconds.   Neurological:     Mental Status: She is alert and oriented to person, place, and time.     ED Results / Procedures / Treatments   Labs (all labs ordered are listed, but only abnormal results are displayed) Labs Reviewed  RESP PANEL BY RT-PCR (FLU A&B, COVID) ARPGX2 - Abnormal; Notable for the following components:      Result Value   SARS Coronavirus 2 by RT PCR POSITIVE (*)    All other components within normal limits  COMPREHENSIVE METABOLIC PANEL - Abnormal; Notable for the following components:   Glucose, Bld 114 (*)    All other components within normal limits  D-DIMER, QUANTITATIVE (NOT AT [facility]) - Abnormal; Notable for the following components:   D-Dimer, Quant 0.89 (*)    All other components within normal limits  FIBRINOGEN - Abnormal; Notable for the following components:   Fibrinogen 545 (*)    All other components within normal limits  CULTURE, BLOOD (ROUTINE X 2)  CULTURE, BLOOD (ROUTINE X 2)  CBC WITH DIFFERENTIAL/PLATELET  BRAIN NATRIURETIC PEPTIDE  LACTIC ACID, PLASMA  LACTATE DEHYDROGENASE  TRIGLYCERIDES  URINALYSIS, ROUTINE W REFLEX MICROSCOPIC  LACTIC ACID, PLASMA  PROCALCITONIN  FERRITIN  C-REACTIVE PROTEIN  TROPONIN I (HIGH SENSITIVITY)  TROPONIN I (HIGH SENSITIVITY)    EKG None  Radiology DG Chest Portable 1 View  Result Date: 01/28/2020 CLINICAL DATA:  Shortness of breath EXAM: PORTABLE CHEST 1 VIEW COMPARISON:  None. FINDINGS: There is a small left pleural effusion. There is atelectatic change in the left base. There is no appreciable edema or consolidation. Relative increased opacity in the right base region is felt to be due to overlying breast tissue. Heart is upper normal in size with pulmonary vascularity normal. No adenopathy. No bone lesions. IMPRESSION: Small left pleural effusion with left base atelectasis. No edema or airspace consolidation. Heart upper normal in size. Electronically Signed   By: Bretta Bang III M.D.   On:  01/28/2020 13:06    Procedures Procedures (including critical care time)  Medications Ordered in ED Medications - No data to display  ED Course  I have reviewed the triage vital signs and the nursing notes.  Pertinent labs & imaging results that were available during my care of the patient were reviewed by me and considered in my medical decision making (see chart for details).    MDM Rules/Calculators/A&P                          Patient presented for evaluation of generalized weakness.  On  exam, patient appears chronically ill.  Scattered and a wet cough noted on exam.  Concern for infection, consider bacterial versus viral.  Also consider heart failure.  As patient became tachycardic and short of breath when going from laying to sitting, consider PE.  Will obtain labs, chest x-ray, Covid test.  Chest x-ray viewed interpreted by me, shows pleural effusion and cardiomegaly.  Labs show mildly elevated dimer, patient also with Covid.  As such, low suspicion for PE, likely elevated dimer due to Covid.  Hypertension also elevated, increase in blood glucose.  Labs otherwise reassuring.  Informed by RN that patient's SPO2 dropped to the upper 80s on room air.  She is baseline 3 L via nasal cannula, sats improved.  Discussed findings with patient.  Discussed that she is Covid positive with new hypoxia, she will need to be admitted for further management. Will call for admission.   Discussed with Dr. Wyonia Hough from [facility] hospitalist service, pt to be admitted.   Final Clinical Impression(s) / ED Diagnoses Final diagnoses:  COVID-19  Hypoxia  Pleural effusion    Rx / DC Orders ED Discharge Orders    None       Alveria Apley, PA-C 01/28/20 1613    Bethann Berkshire, MD 01/29/20 1110

## 2020-01-28 NOTE — ED Notes (Addendum)
Date and time results received: 01/28/20 1426  Test: COVID Critical Value: POSITIVE  Name of Provider Notified: [place] Orders Received? Or Actions Taken?:  Precautions in place

## 2020-01-28 NOTE — ED Notes (Signed)
Small stage 2 sacral wound noted

## 2020-01-29 ENCOUNTER — Encounter (HOSPITAL_COMMUNITY): Payer: Self-pay | Admitting: Internal Medicine

## 2020-01-29 DIAGNOSIS — J90 Pleural effusion, not elsewhere classified: Secondary | ICD-10-CM | POA: Diagnosis present

## 2020-01-29 DIAGNOSIS — U071 COVID-19: Secondary | ICD-10-CM | POA: Diagnosis present

## 2020-01-29 DIAGNOSIS — R0902 Hypoxemia: Secondary | ICD-10-CM

## 2020-01-29 DIAGNOSIS — Z720 Tobacco use: Secondary | ICD-10-CM | POA: Diagnosis present

## 2020-01-29 DIAGNOSIS — R4689 Other symptoms and signs involving appearance and behavior: Secondary | ICD-10-CM | POA: Diagnosis present

## 2020-01-29 LAB — COMPREHENSIVE METABOLIC PANEL
ALT: 26 U/L (ref 0–44)
AST: 30 U/L (ref 15–41)
Albumin: 3.9 g/dL (ref 3.5–5.0)
Alkaline Phosphatase: 71 U/L (ref 38–126)
Anion gap: 9 (ref 5–15)
BUN: 11 mg/dL (ref 6–20)
CO2: 29 mmol/L (ref 22–32)
Calcium: 8.5 mg/dL — ABNORMAL LOW (ref 8.9–10.3)
Chloride: 99 mmol/L (ref 98–111)
Creatinine, Ser: 0.77 mg/dL (ref 0.44–1.00)
GFR, Estimated: >60 mL/min (ref >60)
Glucose, Bld: 151 mg/dL — ABNORMAL HIGH (ref 70–99)
Potassium: 4 mmol/L (ref 3.5–5.1)
Sodium: 137 mmol/L (ref 135–145)
Total Bilirubin: 0.4 mg/dL (ref 0.3–1.2)
Total Protein: 8.2 g/dL — ABNORMAL HIGH (ref 6.5–8.1)

## 2020-01-29 LAB — CBC WITH DIFFERENTIAL/PLATELET
Abs Immature Granulocytes: 0.02 10*3/uL (ref 0.00–0.07)
Basophils Absolute: 0 10*3/uL (ref 0.0–0.1)
Basophils Relative: 0 %
Eosinophils Absolute: 0 10*3/uL (ref 0.0–0.5)
Eosinophils Relative: 0 %
HCT: 45.4 % (ref 36.0–46.0)
Hemoglobin: 14.3 g/dL (ref 12.0–15.0)
Immature Granulocytes: 0 %
Lymphocytes Relative: 13 %
Lymphs Abs: 0.7 10*3/uL (ref 0.7–4.0)
MCH: 31.8 pg (ref 26.0–34.0)
MCHC: 31.5 g/dL (ref 30.0–36.0)
MCV: 100.9 fL — ABNORMAL HIGH (ref 80.0–100.0)
Monocytes Absolute: 0.3 10*3/uL (ref 0.1–1.0)
Monocytes Relative: 5 %
Neutro Abs: 4.2 10*3/uL (ref 1.7–7.7)
Neutrophils Relative %: 82 %
Platelets: 239 10*3/uL (ref 150–400)
RBC: 4.5 MIL/uL (ref 3.87–5.11)
RDW: 14.6 % (ref 11.5–15.5)
WBC: 5.2 10*3/uL (ref 4.0–10.5)
nRBC: 0 % (ref 0.0–0.2)

## 2020-01-29 LAB — HEMOGLOBIN A1C
Hgb A1c MFr Bld: 6.2 % — ABNORMAL HIGH (ref 4.8–5.6)
Mean Plasma Glucose: 131.24 mg/dL

## 2020-01-29 LAB — URINALYSIS, ROUTINE W REFLEX MICROSCOPIC
Bacteria, UA: NONE SEEN
Bilirubin Urine: NEGATIVE
Glucose, UA: NEGATIVE mg/dL
Ketones, ur: NEGATIVE mg/dL
Nitrite: NEGATIVE
Protein, ur: NEGATIVE mg/dL
Specific Gravity, Urine: 1.016 (ref 1.005–1.030)
pH: 5 (ref 5.0–8.0)

## 2020-01-29 LAB — GLUCOSE, CAPILLARY
Glucose-Capillary: 162 mg/dL — ABNORMAL HIGH (ref 70–99)
Glucose-Capillary: 138 mg/dL — ABNORMAL HIGH (ref 70–99)

## 2020-01-29 LAB — CBG MONITORING, ED
Glucose-Capillary: 144 mg/dL — ABNORMAL HIGH (ref 70–99)
Glucose-Capillary: 131 mg/dL — ABNORMAL HIGH (ref 70–99)

## 2020-01-29 LAB — HIV ANTIBODY (ROUTINE TESTING W REFLEX): HIV Screen 4th Generation wRfx: NONREACTIVE

## 2020-01-29 LAB — C-REACTIVE PROTEIN: CRP: 2.3 mg/dL — ABNORMAL HIGH (ref <1.0)

## 2020-01-29 LAB — D-DIMER, QUANTITATIVE: D-Dimer, Quant: 0.79 ug/mL-FEU — ABNORMAL HIGH (ref 0.00–0.50)

## 2020-01-29 MED ORDER — ENOXAPARIN SODIUM 60 MG/0.6ML ~~LOC~~ SOLN
55.0000 mg | SUBCUTANEOUS | Status: DC
  Administered 2020-01-29 – 2020-02-01 (×4): 55 mg via SUBCUTANEOUS
  Filled 2020-01-29 (×4): qty 0.6

## 2020-01-29 NOTE — Evaluation (Signed)
Physical Therapy Evaluation Patient Details Name: Margaret Weber MRN: [id number] DOB: [birth year] Today's Date: 01/29/2020   History of Present Illness  Margaret Weber is a 59 y.o. female with medical history significant of morbid obesity, ongoing tobacco abuse, poor mobility and essentially bedbound over the last year, chronic lower extremity swelling, does not see doctor regularly so no defined medical problems, on no medications at home comes to the hospital with complaints of shortness of breath.  Patient lives with her fianc and he had a URI couple weeks ago.  Ever since, patient also felt URI type symptoms, initially started as a mild sore throat but then progressed to muscle aches, generalized fatigue and eventually shortness of breath over the last couple of days.  She denies any loss of taste or smell.  She reports chills occasionally.  No abdominal pain, but she did have nausea and vomiting.  No diarrhea. She has been complaining of a cough but this is somewhat chronic for her.    Clinical Impression  Patient c/o severe pain in legs/feet and mid back with pressure movement during bed mobility requiring increased time demonstrating slow labored movement, unable to take steps away from bedside due to fall risk, limited to a few side steps at bedside using RW due to weakness and poor standing balance.  Patient put back to bed with Max assist to reposition.  Patient will benefit from continued physical therapy in hospital and recommended venue below to increase strength, balance, endurance for safe ADLs and gait.    Follow Up Recommendations SNF    Equipment Recommendations  Rolling walker with 5" wheels    Recommendations for Other Services       Precautions / Restrictions Precautions Precautions: Fall Restrictions Weight Bearing Restrictions: No      Mobility  Bed Mobility Overal bed mobility: Needs Assistance Bed Mobility: Supine to Sit;Sit to Supine     Supine to sit: Mod  assist;Max assist Sit to supine: Mod assist;Max assist   General bed mobility comments: slow labored movement, c/o severe pain with pressure to legs/feet     Transfers Overall transfer level: Needs assistance Equipment used: Rolling walker (2 wheeled) Transfers: Sit to/from Stand Sit to Stand: Mod assist         General transfer comment: unsteady on feet, increased time, labored movement  Ambulation/Gait Ambulation/Gait assistance: Mod assist;Max assist Gait Distance (Feet): 3 Feet Assistive device: Rolling walker (2 wheeled) Gait Pattern/deviations: Decreased step length - right;Decreased step length - left;Decreased stance time - right;Decreased stance time - left;Decreased stride length Gait velocity: slow   General Gait Details: limited to 3-4 slow labored side steps due to fall risk, poor standing balance  Stairs            Wheelchair Mobility    Modified Rankin (Stroke Patients Only)       Balance Overall balance assessment: Needs assistance Sitting-balance support: Bilateral upper extremity supported;Feet supported Sitting balance-Leahy Scale: Fair Sitting balance - Comments: seated at EOB   Standing balance support: Bilateral upper extremity supported;During functional activity Standing balance-Leahy Scale: Poor Standing balance comment: using RW                             Pertinent Vitals/Pain Pain Assessment: Faces Faces Pain Scale: Hurts whole lot Pain Location: mid back with movement, BLE/feet with any pressure Pain Descriptors / Indicators: Sore;Sharp;Guarding;Grimacing Pain Intervention(s): Limited activity within patient's tolerance;Monitored during session;Repositioned    Home Living  Family/patient expects to be discharged to:: Private residence Living Arrangements: Spouse/significant other Available Help at Discharge: Friend(s);Available PRN/intermittently Type of Home: House Home Access: Stairs to enter Entrance  Stairs-Rails: Doctor, general practice of Steps: 13 Home Layout: One level (to wide to reach both) Home Equipment: Walker - standard;Bedside commode;Wheelchair - manual Additional Comments: Patient states she takes sponge baths seated in chair    Prior Function Level of Independence: Needs assistance   Gait / Transfers Assistance Needed: short distanced household ambulator with pick up walker, assisted for transfers, uses wheelchair for longer distances  ADL's / Homemaking Assistance Needed: assisted by fiancee        Hand Dominance        Extremity/Trunk Assessment   Upper Extremity Assessment Upper Extremity Assessment: Generalized weakness    Lower Extremity Assessment Lower Extremity Assessment: Generalized weakness    Cervical / Trunk Assessment Cervical / Trunk Assessment: Normal  Communication   Communication: No difficulties  Cognition Arousal/Alertness: Awake/alert Behavior During Therapy: WFL for tasks assessed/performed Overall Cognitive Status: Within Functional Limits for tasks assessed                                        General Comments      Exercises     Assessment/Plan    PT Assessment Patient needs continued PT services  PT Problem List Decreased strength;Decreased activity tolerance;Decreased balance;Decreased mobility;Decreased knowledge of use of DME;Pain       PT Treatment Interventions DME instruction;Gait training;Stair training;Functional mobility training;Therapeutic activities;Therapeutic exercise;Balance training;Patient/family education    PT Goals (Current goals can be found in the Care Plan section)  Acute Rehab PT Goals Patient Stated Goal: return home with family to assist PT Goal Formulation: With patient Time For Goal Achievement: 02/12/20 Potential to Achieve Goals: Good    Frequency Min 3X/week   Barriers to discharge        Co-evaluation               AM-PAC PT "6 Clicks"  Mobility  Outcome Measure Help needed turning from your back to your side while in a flat bed without using bedrails?: A Lot Help needed moving from lying on your back to sitting on the side of a flat bed without using bedrails?: A Lot Help needed moving to and from a bed to a chair (including a wheelchair)?: A Lot Help needed standing up from a chair using your arms (e.g., wheelchair or bedside chair)?: A Lot Help needed to walk in hospital room?: A Lot Help needed climbing 3-5 steps with a railing? : Total 6 Click Score: 11    End of Session Equipment Utilized During Treatment: Oxygen Activity Tolerance: Patient tolerated treatment well;Patient limited by fatigue;Patient limited by pain Patient left: in bed;with call bell/phone within reach Nurse Communication: Mobility status PT Visit Diagnosis: Unsteadiness on feet (R26.81);Other abnormalities of gait and mobility (R26.89);Muscle weakness (generalized) (M62.81)    Time: 0950-1020 PT Time Calculation (min) (ACUTE ONLY): 30 min   Charges:   PT Evaluation $PT Eval Moderate Complexity: 1 Mod PT Treatments $Therapeutic Activity: 23-37 mins        12:06 PM, 01/29/20 Ocie Bob, MPT Physical Therapist with [facility] [phone] office [phone] mobile phone

## 2020-01-29 NOTE — TOC Initial Note (Signed)
Transition of Care (TOCP) - Initial/Assessment Note    Patient Details  Name: Margaret Weber MRM: [id number] Date of Birth: 05/30/1960  Transition of Care (TOCP) CM/SW Contact:    Annice Needy, LCSW Phone Number: 01/29/2020, 3:19 PM  Clinical Narrative:                 Patient from home with fiance. OVID+. No insurance. Appears to have difficulty providing self care. Referral with APS not accepted by [facility] as patient does not have a cognitive impairment, nor is she being abused, neglected, or exploited. Patient has a right to provide poor self care per [facility] at [facility]PS.   Expected Discharge Plan: Home w Home Health Services Barriers to Discharge: Continued Medical Work up   Patient Goals and CMS Choice        Expected Discharge Plan and Services Expected Discharge Plan: Home w Home Health Services                                              Prior Living Arrangements/Services   Lives with:: Significant Other Patient language and need for interpreter reviewed:: Yes Do you feel safe going back to the place where you live?: Yes      Need for Family Participation in Patient Care: Yes (Comment) Care giver support system in place?: Yes (comment)   Criminal Activity/Legal Involvement Pertinent to Current Situation/Hospitalization: No - Comment as needed  Activities of Daily Living Home Assistive Devices/Equipment: Cane (specify quad or straight), Eyeglasses, Walker (specify type), Shower chair with back, Bedside commode/3-in-1 ADL Screening (condition at time of admission) Patient's cognitive ability adequate to safely complete daily activities?: Yes Is the patient deaf or have difficulty hearing?: No Does the patient have difficulty seeing, even when wearing glasses/contacts?: No Does the patient have difficulty concentrating, remembering, or making decisions?: No Patient able to express need for assistance with ADLs?: Yes Does the patient have difficulty  dressing or bathing?: No Independently performs ADLs?: Yes (appropriate for developmental age) Does the patient have difficulty walking or climbing stairs?: No Weakness of Legs: None Weakness of Arms/Hands: None  Permission Sought/Granted                  Emotional Assessment Appearance:: Appears younger than stated age   Affect (typically observed): Appropriate Orientation: : Oriented to Self, Oriented to Place, Oriented to  Time, Oriented to Situation Alcohol / Substance Use: Not Applicable Psych Involvement: No (comment)  Admission diagnosis:  Pleural effusion [J90] Hypoxia [R09.02] Acute hypoxemic respiratory failure due to COVID-19 (HCC) [U07.1, J96.01] COVID-19 [U07.1] Patient Active Problem List   Diagnosis Date Noted  . Acute hypoxemic respiratory failure due to COVID-19 [facility]) 01/28/2020   PCP:  Patient, No Pcp Per Pharmacy:   [facility] [address] [place] - [address] [place] [address] Phone: [phone] Fax: [phone]     Social Determinants of Health (SDOH) Interventions    Readmission Risk Interventions No flowsheet data found.

## 2020-01-29 NOTE — Progress Notes (Signed)
Patient's significant other called to discuss the pt's hygiene.  He states that she fell several years ago and broke her arm and since that time, she has been so afraid of falling to the point that she doesn't do anything but sit in her chair at home and smoke.  He said he bought her a shower chair and offers to bathe her, but she refuses because she is afraid of falling. He did also state that he has a bad back and has dropped her several times.    I did notice that the patient isn't very motivated and often states that she can't do whatever it is she is asked to do.

## 2020-01-29 NOTE — ED Notes (Signed)
Report given to [facility].  Pt to room 341

## 2020-01-29 NOTE — ED Notes (Signed)
Report given to oncoming nurse.

## 2020-01-29 NOTE — Plan of Care (Signed)
  Problem: Acute Rehab PT Goals(only PT should resolve) Goal: Pt Will Go Supine/Side To Sit Outcome: Progressing Flowsheets (Taken 01/29/2020 1208) Pt will go Supine/Side to Sit:  with minimal assist  with moderate assist Goal: Patient Will Transfer Sit To/From Stand Outcome: Progressing Flowsheets (Taken 01/29/2020 1208) Patient will transfer sit to/from stand:  with minimal assist  with moderate assist Goal: Pt Will Transfer Bed To Chair/Chair To Bed Outcome: Progressing Flowsheets (Taken 01/29/2020 1208) Pt will Transfer Bed to Chair/Chair to Bed: with mod assist Goal: Pt Will Ambulate Outcome: Progressing Flowsheets (Taken 01/29/2020 1208) Pt will Ambulate:  15 feet  with moderate assist  with rolling walker   12:09 PM, 01/29/20 Ocie Bob, MPT Physical Therapist with [facility] 336 [phone] office [phone] mobile phone

## 2020-01-29 NOTE — Progress Notes (Signed)
PROGRESS NOTE   Margaret Weber  GGY:[id number] DOB: February 24, 1961 DOA: 01/28/2020 PCP: Patient, No Pcp Per   Chief Complaint  Patient presents with  . Weakness    Brief Admission History:  59 y.o. female with medical history significant of morbid obesity, ongoing tobacco abuse, poor mobility and essentially bedbound over the last year, chronic lower extremity swelling, does not see doctor regularly so no defined medical problems, on no medications at home comes to the hospital with complaints of shortness of breath.  Patient lives with her fianc and he had a URI couple weeks ago.  Ever since, patient also felt URI type symptoms, initially started as a mild sore throat but then progressed to muscle aches, generalized fatigue and eventually shortness of breath over the last couple of days.  She denies any loss of taste or smell.  She reports chills occasionally.  No abdominal pain, but she did have nausea and vomiting.  No diarrhea. She has been complaining of a cough but this is somewhat chronic for her.  ED Course: In the ED she is afebrile 98.6, tachypneic with respiratory rate 23, normotensive.  She was satting in the mid 80s on room air requiring 3 L supplemental oxygen.  Blood work shows normal CMP with a glucose of 114, BNP is normal, high-sensitivity troponin normal, CRP mildly elevated 2.3, negative procalcitonin and lactic acid of 1.2.  CBC is unremarkable.  Chest x-ray with small pleural effusion left base atelectasis.  She tested positive for COVID-19, and due to hypoxia we are asked to admit  Assessment & Plan:   Active Problems:   Acute hypoxemic respiratory failure due to COVID-19 [facility])   Tobacco abuse   Self neglect   Continue current management for Covid infection as patient is unvaccinated.  I have asked for APS referral for self neglect and poor self care.  See orders.    DVT prophylaxis: enoxaparin Code Status: full Family Communication: husband Disposition: home with  HH  Status is: Inpatient  Remains inpatient appropriate because:Inpatient level of care appropriate due to severity of illness   Dispo: The patient is from: Home              Anticipated d/c is to: Home              Anticipated d/c date is: 2 days              Patient currently is not medically stable to d/c.         Consultants:   N/a   Procedures:   n/a  Antimicrobials:  remdesivir   Subjective: Pt poor historian but says she is short of breath  Objective: Vitals:   01/29/20 1130 01/29/20 1145 01/29/20 1300 01/29/20 1620  BP:   137/79 (!) 131/108  Pulse: 74 69 85 79  Resp: 14 14 16 18   Temp:   98.6 F (37 C)   TempSrc:   Oral   SpO2: 95% 97% 90% 94%  Weight:      Height:        Intake/Output Summary (Last 24 hours) at 01/29/2020 1842 Last data filed at 01/28/2020 2103 Gross per 24 hour  Intake 210 ml  Output --  Net 210 ml   Filed Weights   01/28/20 1243  Weight: 113.4 kg    Examination:  General exam: morbidly obese female, poorly groomed, Appears calm and comfortable  Respiratory system: Clear to auscultation. Respiratory effort normal. Cardiovascular system: S1 & S2 heard, RRR. No JVD,  murmurs, rubs, gallops or clicks. No pedal edema. Gastrointestinal system: Abdomen is nondistended, soft and nontender. No organomegaly or masses felt. Normal bowel sounds heard. Central nervous system: Alert and oriented. No focal neurological deficits. Extremities: Symmetric 5 x 5 power. Skin: severe ichthyosis BLEs, feet warm bilateral. Psychiatry:  Mood & affect flat.   Data Reviewed: I have personally reviewed following labs and imaging studies  CBC: Recent Labs  Lab 01/28/20 1434 01/29/20 0652  WBC 6.3 5.2  NEUTROABS 4.5 4.2  HGB 14.1 14.3  HCT 44.7 45.4  MCV 99.3 100.9*  PLT 248 239    Basic Metabolic Panel: Recent Labs  Lab 01/28/20 1434 01/29/20 0652  NA 136 137  K 3.9 4.0  CL 99 99  CO2 27 29  GLUCOSE 114* 151*  BUN 11 11   CREATININE 0.82 0.77  CALCIUM 9.0 8.5*    GFR: Estimated Creatinine Clearance: 92.3 mL/min (by C-G formula based on SCr of 0.77 mg/dL).  Liver Function Tests: Recent Labs  Lab 01/28/20 1434 01/29/20 0652  AST 20 30  ALT 25 26  ALKPHOS 71 71  BILITOT 0.4 0.4  PROT 8.0 8.2*  ALBUMIN 4.1 3.9    CBG: Recent Labs  Lab 01/28/20 2148 01/29/20 0748 01/29/20 1136 01/29/20 1653  GLUCAP 138* 144* 131* 162*    Recent Results (from the past 240 hour(s))  Resp Panel by RT-PCR (Flu A&B, Covid) Nasopharyngeal Swab     Status: Abnormal   Collection Time: 01/28/20 12:41 PM   Specimen: Nasopharyngeal Swab; Nasopharyngeal(NP) swabs in vial transport medium  Result Value Ref Range Status   SARS Coronavirus 2 by RT PCR POSITIVE (A) NEGATIVE Final    Comment: RESULT CALLED TO, READ BACK BY AND VERIFIED WITH: BRAME M AT 1426 BY THOMPSON S.ON [id number] (NOTE) SARS-CoV-2 target nucleic acids are DETECTED.  The SARS-CoV-2 RNA is generally detectable in upper respiratory specimens during the acute phase of infection. Positive results are indicative of the presence of the identified virus, but do not rule out bacterial infection or co-infection with other pathogens not detected by the test. Clinical correlation with patient history and other diagnostic information is necessary to determine patient infection status. The expected result is Negative.  Fact Sheet for Patients: BloggerCourse.comhttps://www.fda.gov/media/152166/download  Fact Sheet for Healthcare Providers: SeriousBroker.ithttps://www.fda.gov/media/152162/download  This test is not yet approved or cleared by the [place] FDA and  has been authorized for detection and/or diagnosis of SARS-CoV-2 by FDA under an Emergency Use Authorization (EUA).  This EUA will remain in effect (meaning this test  can be used) for the duration of  the COVID-19 declaration under Section 564(b)(1) of the Act, 21 U.S.C. section 360bbb-3(b)(1), unless the authorization  is terminated or revoked sooner.     Influenza A by PCR NEGATIVE NEGATIVE Final   Influenza B by PCR NEGATIVE NEGATIVE Final    Comment: (NOTE) The Xpert Xpress SARS-CoV-2/FLU/RSV plus assay is intended as an aid in the diagnosis of influenza from Nasopharyngeal swab specimens and should not be used as a sole basis for treatment. Nasal washings and aspirates are unacceptable for Xpert Xpress SARS-CoV-2/FLU/RSV testing.  Fact Sheet for Patients: BloggerCourse.comhttps://www.fda.gov/media/152166/download  Fact Sheet for Healthcare Providers: SeriousBroker.ithttps://www.fda.gov/media/152162/download  This test is not yet approved or cleared by the [place] FDA and has been authorized for detection and/or diagnosis of SARS-CoV-2 by FDA under an Emergency Use Authorization (EUA). This EUA will remain in effect (meaning this test can be used) for the duration of the COVID-19 declaration under Section  564(b)(1) of the Act, 21 U.S.C. section 360bbb-3(b)(1), unless the authorization is terminated or revoked.  Performed at [facility], [address] [place], [place] [address]   Blood Culture (routine x 2)     Status: None (Preliminary result)   Collection Time: 01/28/20  3:30 PM   Specimen: BLOOD RIGHT HAND  Result Value Ref Range Status   Specimen Description BLOOD RIGHT HAND  Final   Special Requests   Final    BOTTLES DRAWN AEROBIC AND ANAEROBIC Blood Culture results may not be optimal due to an inadequate volume of blood received in culture bottles   Culture   Final    NO GROWTH < 24 HOURS Performed at [facility], [address], [place], [place] [address]    Report Status PENDING  Incomplete  Blood Culture (routine x 2)     Status: None (Preliminary result)   Collection Time: 01/28/20  3:30 PM   Specimen: Left Antecubital; Blood  Result Value Ref Range Status   Specimen Description LEFT ANTECUBITAL  Final   Special Requests   Final    BOTTLES DRAWN AEROBIC AND ANAEROBIC Blood Culture results may not  be optimal due to an inadequate volume of blood received in culture bottles   Culture   Final    NO GROWTH < 24 HOURS Performed at [facility], [address], [place], [place] [address]    Report Status PENDING  Incomplete     Radiology Studies: DG Chest Portable 1 View  Result Date: 01/28/2020 CLINICAL DATA:  Shortness of breath EXAM: PORTABLE CHEST 1 VIEW COMPARISON:  None. FINDINGS: There is a small left pleural effusion. There is atelectatic change in the left base. There is no appreciable edema or consolidation. Relative increased opacity in the right base region is felt to be due to overlying breast tissue. Heart is upper normal in size with pulmonary vascularity normal. No adenopathy. No bone lesions. IMPRESSION: Small left pleural effusion with left base atelectasis. No edema or airspace consolidation. Heart upper normal in size. Electronically Signed   By: Bretta Bang III M.D.   On: 01/28/2020 13:06     Scheduled Meds: . vitamin C  500 mg Oral Daily  . enoxaparin (LOVENOX) injection  55 mg Subcutaneous Q24H  . insulin aspart  0-9 Units Subcutaneous TID WC  . methylPREDNISolone (SOLU-MEDROL) injection  0.5 mg/kg Intravenous Q12H   Followed by  . [START ON 01/31/2020] predniSONE  50 mg Oral Daily  . nicotine  21 mg Transdermal Daily  . sodium chloride flush  3 mL Intravenous Q12H  . zinc sulfate  220 mg Oral Daily   Continuous Infusions: . sodium chloride    . remdesivir 100 mg in NS 100 mL 100 mg (01/29/20 1047)     LOS: 1 day   Time spent: 36 mins   Hortense Cantrall Laural Benes, MD How to contact the [facility] Attending or Consulting provider 7A - 7P or covering provider during after hours 7P -7A, for this patient?  1. Check the care team in [facility] and look for a) attending/consulting [facility] provider listed and b) the [facility] team listed 2. Log into [email or web address] and use [facility] [place]'s universal password to access. If you do not have the password, please contact the hospital  operator. 3. Locate the [facility] provider you are looking for under [facility] and page to a number that you can be directly reached. 4. If you still have difficulty reaching the provider, please page the [facility] (Director on Call) for  the Hospitalists listed on amion for assistance.  01/29/2020, 6:42 PM

## 2020-01-30 ENCOUNTER — Encounter (HOSPITAL_COMMUNITY): Payer: Self-pay | Admitting: Internal Medicine

## 2020-01-30 ENCOUNTER — Inpatient Hospital Stay (HOSPITAL_COMMUNITY): Payer: HRSA Program

## 2020-01-30 DIAGNOSIS — R600 Localized edema: Secondary | ICD-10-CM

## 2020-01-30 DIAGNOSIS — R0602 Shortness of breath: Secondary | ICD-10-CM

## 2020-01-30 DIAGNOSIS — U071 COVID-19: Secondary | ICD-10-CM

## 2020-01-30 DIAGNOSIS — G4700 Insomnia, unspecified: Secondary | ICD-10-CM

## 2020-01-30 LAB — COMPREHENSIVE METABOLIC PANEL
ALT: 27 U/L (ref 0–44)
AST: 27 U/L (ref 15–41)
Albumin: 3.8 g/dL (ref 3.5–5.0)
Alkaline Phosphatase: 64 U/L (ref 38–126)
Anion gap: 11 (ref 5–15)
BUN: 17 mg/dL (ref 6–20)
CO2: 26 mmol/L (ref 22–32)
Calcium: 8.5 mg/dL — ABNORMAL LOW (ref 8.9–10.3)
Chloride: 101 mmol/L (ref 98–111)
Creatinine, Ser: 0.68 mg/dL (ref 0.44–1.00)
GFR, Estimated: >60 mL/min (ref >60)
Glucose, Bld: 134 mg/dL — ABNORMAL HIGH (ref 70–99)
Potassium: 3.9 mmol/L (ref 3.5–5.1)
Sodium: 138 mmol/L (ref 135–145)
Total Bilirubin: 0.6 mg/dL (ref 0.3–1.2)
Total Protein: 7.7 g/dL (ref 6.5–8.1)

## 2020-01-30 LAB — CBC WITH DIFFERENTIAL/PLATELET
Abs Immature Granulocytes: 0.02 10*3/uL (ref 0.00–0.07)
Basophils Absolute: 0 10*3/uL (ref 0.0–0.1)
Basophils Relative: 0 %
Eosinophils Absolute: 0 10*3/uL (ref 0.0–0.5)
Eosinophils Relative: 0 %
HCT: 44.4 % (ref 36.0–46.0)
Hemoglobin: 14.1 g/dL (ref 12.0–15.0)
Immature Granulocytes: 0 %
Lymphocytes Relative: 13 %
Lymphs Abs: 1.2 10*3/uL (ref 0.7–4.0)
MCH: 31.4 pg (ref 26.0–34.0)
MCHC: 31.8 g/dL (ref 30.0–36.0)
MCV: 98.9 fL (ref 80.0–100.0)
Monocytes Absolute: 0.8 10*3/uL (ref 0.1–1.0)
Monocytes Relative: 9 %
Neutro Abs: 7.1 10*3/uL (ref 1.7–7.7)
Neutrophils Relative %: 78 %
Platelets: 263 10*3/uL (ref 150–400)
RBC: 4.49 MIL/uL (ref 3.87–5.11)
RDW: 14.3 % (ref 11.5–15.5)
WBC: 9.1 10*3/uL (ref 4.0–10.5)
nRBC: 0 % (ref 0.0–0.2)

## 2020-01-30 LAB — C-REACTIVE PROTEIN: CRP: 0.7 mg/dL (ref <1.0)

## 2020-01-30 LAB — ECHOCARDIOGRAM COMPLETE
Area-P 1/2: 2.8 cm2
Height: 64 in
S' Lateral: 3.24 cm
Weight: 4000 oz

## 2020-01-30 LAB — GLUCOSE, CAPILLARY
Glucose-Capillary: 139 mg/dL — ABNORMAL HIGH (ref 70–99)
Glucose-Capillary: 144 mg/dL — ABNORMAL HIGH (ref 70–99)
Glucose-Capillary: 148 mg/dL — ABNORMAL HIGH (ref 70–99)
Glucose-Capillary: 160 mg/dL — ABNORMAL HIGH (ref 70–99)

## 2020-01-30 LAB — D-DIMER, QUANTITATIVE: D-Dimer, Quant: 0.67 ug/mL-FEU — ABNORMAL HIGH (ref 0.00–0.50)

## 2020-01-30 MED ORDER — ACETAMINOPHEN 325 MG PO TABS
650.0000 mg | ORAL_TABLET | Freq: Four times a day (QID) | ORAL | Status: DC
  Administered 2020-01-30 – 2020-02-02 (×10): 650 mg via ORAL
  Filled 2020-01-30 (×11): qty 2

## 2020-01-30 MED ORDER — HYDROCORTISONE 1 % EX CREA
1.0000 "application " | TOPICAL_CREAM | Freq: Three times a day (TID) | CUTANEOUS | Status: DC | PRN
  Filled 2020-01-30: qty 28

## 2020-01-30 MED ORDER — TRAMADOL HCL 50 MG PO TABS
50.0000 mg | ORAL_TABLET | Freq: Four times a day (QID) | ORAL | Status: DC | PRN
  Administered 2020-01-30: 50 mg via ORAL
  Filled 2020-01-30: qty 1

## 2020-01-30 MED ORDER — HYDROXYZINE HCL 25 MG PO TABS
25.0000 mg | ORAL_TABLET | Freq: Every evening | ORAL | Status: DC | PRN
  Administered 2020-01-30 – 2020-02-01 (×6): 25 mg via ORAL
  Filled 2020-01-30 (×6): qty 1

## 2020-01-30 NOTE — Progress Notes (Signed)
TRH night shift.  The patient asked the staff to give her something for insomnia. However, she also stated that she is mostly at daytime sleeper. The staff reports that she seems to be anxious given her COVID-19 pneumonia, but she has not been tachypneic. Her O2 sats have been being in the low to mid 90s on 2-3 LPM via [place]. Her recent vital signs are stable. I will try hydroxyzine 25 mg p.o. nightly as needed, which may be repeated once if the initial 25 mg are not effective.  Sanda Klein, MD

## 2020-01-30 NOTE — Progress Notes (Signed)
Physical Therapy Treatment Patient Details Name: Margaret Weber MRN: [id number] DOB: 09/29/1960 Today's Date: 01/30/2020    History of Present Illness Margaret Weber is a 59 y.o. female with medical history significant of morbid obesity, ongoing tobacco abuse, poor mobility and essentially bedbound over the last year, chronic lower extremity swelling, does not see doctor regularly so no defined medical problems, on no medications at home comes to the hospital with complaints of shortness of breath.  Patient lives with her fianc and he had a URI couple weeks ago.  Ever since, patient also felt URI type symptoms, initially started as a mild sore throat but then progressed to muscle aches, generalized fatigue and eventually shortness of breath over the last couple of days.  She denies any loss of taste or smell.  She reports chills occasionally.  No abdominal pain, but she did have nausea and vomiting.  No diarrhea. She has been complaining of a cough but this is somewhat chronic for her.    PT Comments    Patient demonstrates slight improvement for sitting up at bedside, had difficulty moving BLE and lean on LUE due weakness and pain in legs, tolerated taking a few steps at bedside, but limited due to fatigue and BLE weakness.  Patient tolerated sitting up in chair after therapy - RN aware.  Patient will benefit from continued physical therapy in hospital and recommended venue below to increase strength, balance, endurance for safe ADLs and gait.   Follow Up Recommendations  SNF     Equipment Recommendations  Rolling walker with 5" wheels    Recommendations for Other Services       Precautions / Restrictions Precautions Precautions: Fall    Mobility  Bed Mobility Overal bed mobility: Needs Assistance Bed Mobility: Supine to Sit     Supine to sit: Mod assist     General bed mobility comments: demonstrates improvement for sitting up at bedside, requires assistance to move BLE and  diffiuclty leaning on LUE due to weakness  Transfers Overall transfer level: Needs assistance Equipment used: Rolling walker (2 wheeled) Transfers: Sit to/from [employer] Sit to Stand: Mod assist Stand pivot transfers: Mod assist       General transfer comment: slow labored movement  Ambulation/Gait Ambulation/Gait assistance: Mod assist Gait Distance (Feet): 5 Feet Assistive device: Rolling walker (2 wheeled) Gait Pattern/deviations: Decreased step length - right;Decreased step length - left;Decreased stance time - right;Decreased stance time - left;Decreased stride length Gait velocity: slow   General Gait Details: slightly improved balance for taking 4-5 steps before having to sit due to fatigue   Stairs             Wheelchair Mobility    Modified Rankin (Stroke Patients Only)       Balance Overall balance assessment: Needs assistance Sitting-balance support: Feet supported;No upper extremity supported Sitting balance-Leahy Scale: Fair Sitting balance - Comments: fair/good seated at EOB   Standing balance support: Bilateral upper extremity supported;During functional activity Standing balance-Leahy Scale: Poor Standing balance comment: fair/poor using RW                            Cognition Arousal/Alertness: Awake/alert Behavior During Therapy: WFL for tasks assessed/performed Overall Cognitive Status: Within Functional Limits for tasks assessed  Exercises General Exercises - Lower Extremity Long Arc Quad: Seated;AROM;Strengthening;Both;10 reps Hip Flexion/Marching: Seated;AROM;Strengthening;Both;10 reps Toe Raises: Seated;AROM;Strengthening;Both;10 reps Heel Raises: Seated;AROM;Strengthening;10 reps;Both    General Comments        Pertinent Vitals/Pain Pain Assessment: Faces Faces Pain Scale: Hurts even more Pain Location: mid back with movement, BLE/feet with  any pressure Pain Descriptors / Indicators: Grimacing;Sore;Discomfort Pain Intervention(s): Limited activity within patient's tolerance;Repositioned    Home Living                      Prior Function            PT Goals (current goals can now be found in the care plan section) Acute Rehab PT Goals Patient Stated Goal: return home with family to assist Time For Goal Achievement: 02/12/20 Potential to Achieve Goals: Good Progress towards PT goals: Progressing toward goals    Frequency    Min 3X/week      PT Plan Current plan remains appropriate    Co-evaluation              AM-PAC PT "6 Clicks" Mobility   Outcome Measure  Help needed turning from your back to your side while in a flat bed without using bedrails?: A Lot Help needed moving from lying on your back to sitting on the side of a flat bed without using bedrails?: A Lot Help needed moving to and from a bed to a chair (including a wheelchair)?: A Lot Help needed standing up from a chair using your arms (e.g., wheelchair or bedside chair)?: A Lot Help needed to walk in hospital room?: A Lot Help needed climbing 3-5 steps with a railing? : Total 6 Click Score: 11    End of Session   Activity Tolerance: Patient tolerated treatment well;Patient limited by fatigue Patient left: in chair;with call bell/phone within reach;with chair alarm set Nurse Communication: Mobility status PT Visit Diagnosis: Unsteadiness on feet (R26.81);Other abnormalities of gait and mobility (R26.89);Muscle weakness (generalized) (M62.81)     Time: 2979-8921 PT Time Calculation (min) (ACUTE ONLY): 32 min  Charges:  $Therapeutic Exercise: 8-22 mins $Therapeutic Activity: 8-22 mins                     1:55 PM, 01/30/20 Ocie Bob, MPT Physical Therapist with [facility] [phone] office [phone] mobile phone

## 2020-01-30 NOTE — Progress Notes (Signed)
*  PRELIMINARY RESULTS* Echocardiogram 2D Echocardiogram has been performed.  Margaret Weber 01/30/2020, 11:17 AM

## 2020-01-30 NOTE — Progress Notes (Signed)
PROGRESS NOTE   Margaret Weber  VOJ:[id number] DOB: 1960-04-10 DOA: 01/28/2020 PCP: Patient, No Pcp Per   Chief Complaint  Patient presents with  . Weakness    Brief Admission History:  59 y.o. female with medical history significant of morbid obesity, ongoing tobacco abuse, poor mobility and essentially bedbound over the last year, chronic lower extremity swelling, does not see doctor regularly so no defined medical problems, on no medications at home comes to the hospital with complaints of shortness of breath.  Patient lives with her fianc and he had a URI couple weeks ago.  Ever since, patient also felt URI type symptoms, initially started as a mild sore throat but then progressed to muscle aches, generalized fatigue and eventually shortness of breath over the last couple of days.  She denies any loss of taste or smell.  She reports chills occasionally.  No abdominal pain, but she did have nausea and vomiting.  No diarrhea. She has been complaining of a cough but this is somewhat chronic for her.  ED Course: In the ED she is afebrile 98.6, tachypneic with respiratory rate 23, normotensive.  She was satting in the mid 80s on room air requiring 3 L supplemental oxygen.  Blood work shows normal CMP with a glucose of 114, BNP is normal, high-sensitivity troponin normal, CRP mildly elevated 2.3, negative procalcitonin and lactic acid of 1.2.  CBC is unremarkable.  Chest x-ray with small pleural effusion left base atelectasis.  She tested positive for COVID-19, and due to hypoxia we are asked to admit  Assessment & Plan:   Active Problems:   Acute hypoxemic respiratory failure due to COVID-19 [facility])   Tobacco abuse   Self neglect   COVID-19   Hypoxia   Pleural effusion  Acute respiratory failure with hypoxia secondary to covid pneumonia. Pt remains on supplemental oxygen 3L/min but no escalation of oxygen requirement. Continue current management for Covid infection as patient is  unvaccinated.  I have asked for APS referral for self neglect and poor self care. Given patient has poor mobility and no transportation it is not feasible to arrange for outpatient remdesivir infusion.  Pt will need to complete remdesivir in the hospital and if stable can discharge home at that time. TOC telling me that they will be able to arrange charity home oxygen and charity home health services.    BLE edema  - chronic venous stasis BLEs - check 2D echocardiogram.  Check venous doppler BLEs.   Severe ichthyosis bilateral feet and legs - patient will need to follow up with dermatology outpatient for further management.  I have asked TOC team to assist with obtaining primary care services for patient.   Chronic active nicotine dependence - nicotine patch ordered, counseled on smoking cessation.     DVT prophylaxis: enoxaparin Code Status: full Family Communication: husband Disposition: home with HH  Status is: Inpatient  Remains inpatient appropriate because:Inpatient level of care appropriate due to severity of illness  Dispo: The patient is from: Home              Anticipated d/c is to: Home              Anticipated d/c date is: 3 days              Patient currently is not medically stable to d/c.  Consultants:   N/a  Procedures:   n/a  Antimicrobials:  remdesivir   Subjective: Pt having back pain and having a difficult time  sleeping.   Objective: Vitals:   01/29/20 1620 01/29/20 2008 01/29/20 2150 01/30/20 0649  BP: (!) 131/108  134/75 117/74  Pulse: 79  92 82  Resp: 18  18 20   Temp:   98.5 F (36.9 C) 98.2 F (36.8 C)  TempSrc:   Oral Oral  SpO2: 94% 91% 91% 94%  Weight:      Height:        Intake/Output Summary (Last 24 hours) at 01/30/2020 1125 Last data filed at 01/30/2020 1013 Gross per 24 hour  Intake 250 ml  Output 500 ml  Net -250 ml   Filed Weights   01/28/20 1243  Weight: 113.4 kg    Examination:  General exam: morbidly obese female,  poorly groomed, Appears calm and comfortable  Respiratory system: Clear to auscultation. Respiratory effort normal. Cardiovascular system: S1 & S2 heard, RRR. No JVD, murmurs, rubs, gallops or clicks. No pedal edema. Gastrointestinal system: Abdomen is nondistended, soft and nontender. No organomegaly or masses felt. Normal bowel sounds heard. Central nervous system: Alert and oriented. No focal neurological deficits. Extremities: Symmetric 5 x 5 power. Skin: severe ichthyosis BLEs, feet warm bilateral. Edema BLEs, chronic venous stasis changes.  Psychiatry:  Mood & affect flat.   Data Reviewed: I have personally reviewed following labs and imaging studies  CBC: Recent Labs  Lab 01/28/20 1434 01/29/20 0652 01/30/20 0956  WBC 6.3 5.2 9.1  NEUTROABS 4.5 4.2 7.1  HGB 14.1 14.3 14.1  HCT 44.7 45.4 44.4  MCV 99.3 100.9* 98.9  PLT 248 239 263    Basic Metabolic Panel: Recent Labs  Lab 01/28/20 1434 01/29/20 0652 01/30/20 0956  NA 136 137 138  K 3.9 4.0 3.9  CL 99 99 101  CO2 27 29 26   GLUCOSE 114* 151* 134*  BUN 11 11 17   CREATININE 0.82 0.77 0.68  CALCIUM 9.0 8.5* 8.5*    GFR: Estimated Creatinine Clearance: 92.3 mL/min (by C-G formula based on SCr of 0.68 mg/dL).  Liver Function Tests: Recent Labs  Lab 01/28/20 1434 01/29/20 0652 01/30/20 0956  AST 20 30 27   ALT 25 26 27   ALKPHOS 71 71 64  BILITOT 0.4 0.4 0.6  PROT 8.0 8.2* 7.7  ALBUMIN 4.1 3.9 3.8    CBG: Recent Labs  Lab 01/29/20 0748 01/29/20 1136 01/29/20 1653 01/29/20 2023 01/30/20 0918  GLUCAP 144* 131* 162* 138* 139*    Recent Results (from the past 240 hour(s))  Resp Panel by RT-PCR (Flu A&B, Covid) Nasopharyngeal Swab     Status: Abnormal   Collection Time: 01/28/20 12:41 PM   Specimen: Nasopharyngeal Swab; Nasopharyngeal(NP) swabs in vial transport medium  Result Value Ref Range Status   SARS Coronavirus 2 by RT PCR POSITIVE (A) NEGATIVE Final    Comment: RESULT CALLED TO, READ BACK BY  AND VERIFIED WITH: BRAME M AT 1426 BY THOMPSON S.ON 14/02/21 (NOTE) SARS-CoV-2 target nucleic acids are DETECTED.  The SARS-CoV-2 RNA is generally detectable in upper respiratory specimens during the acute phase of infection. Positive results are indicative of the presence of the identified virus, but do not rule out bacterial infection or co-infection with other pathogens not detected by the test. Clinical correlation with patient history and other diagnostic information is necessary to determine patient infection status. The expected result is Negative.  Fact Sheet for Patients: 14/02/21  Fact Sheet for Healthcare Providers: 14/02/21  This test is not yet approved or cleared by the 14/03/21 FDA and  has been authorized for detection  and/or diagnosis of SARS-CoV-2 by FDA under an Emergency Use Authorization (EUA).  This EUA will remain in effect (meaning this test  can be used) for the duration of  the COVID-19 declaration under Section 564(b)(1) of the Act, 21 U.S.C. section 360bbb-3(b)(1), unless the authorization is terminated or revoked sooner.     Influenza A by PCR NEGATIVE NEGATIVE Final   Influenza B by PCR NEGATIVE NEGATIVE Final    Comment: (NOTE) The Xpert Xpress SARS-CoV-2/FLU/RSV plus assay is intended as an aid in the diagnosis of influenza from Nasopharyngeal swab specimens and should not be used as a sole basis for treatment. Nasal washings and aspirates are unacceptable for Xpert Xpress SARS-CoV-2/FLU/RSV testing.  Fact Sheet for Patients: BloggerCourse.com  Fact Sheet for Healthcare Providers: SeriousBroker.it  This test is not yet approved or cleared by the [place] FDA and has been authorized for detection and/or diagnosis of SARS-CoV-2 by FDA under an Emergency Use Authorization (EUA). This EUA will remain in effect (meaning  this test can be used) for the duration of the COVID-19 declaration under Section 564(b)(1) of the Act, 21 U.S.C. section 360bbb-3(b)(1), unless the authorization is terminated or revoked.  Performed at [facility], [address], [place], [place] [address]   Blood Culture (routine x 2)     Status: None (Preliminary result)   Collection Time: 01/28/20  3:30 PM   Specimen: BLOOD RIGHT HAND  Result Value Ref Range Status   Specimen Description BLOOD RIGHT HAND  Final   Special Requests   Final    BOTTLES DRAWN AEROBIC AND ANAEROBIC Blood Culture results may not be optimal due to an inadequate volume of blood received in culture bottles   Culture   Final    NO GROWTH 2 DAYS Performed at [facility], [address] [place], [place] [address]    Report Status PENDING  Incomplete  Blood Culture (routine x 2)     Status: None (Preliminary result)   Collection Time: 01/28/20  3:30 PM   Specimen: Left Antecubital; Blood  Result Value Ref Range Status   Specimen Description LEFT ANTECUBITAL  Final   Special Requests   Final    BOTTLES DRAWN AEROBIC AND ANAEROBIC Blood Culture results may not be optimal due to an inadequate volume of blood received in culture bottles   Culture   Final    NO GROWTH 2 DAYS Performed at [facility], [address], [place], [place] [address]    Report Status PENDING  Incomplete     Radiology Studies: DG Chest Portable 1 View  Result Date: 01/28/2020 CLINICAL DATA:  Shortness of breath EXAM: PORTABLE CHEST 1 VIEW COMPARISON:  None. FINDINGS: There is a small left pleural effusion. There is atelectatic change in the left base. There is no appreciable edema or consolidation. Relative increased opacity in the right base region is felt to be due to overlying breast tissue. Heart is upper normal in size with pulmonary vascularity normal. No adenopathy. No bone lesions. IMPRESSION: Small left pleural effusion with left base atelectasis. No edema or airspace  consolidation. Heart upper normal in size. Electronically Signed   By: Bretta Bang III M.D.   On: 01/28/2020 13:06   Scheduled Meds: . acetaminophen  650 mg Oral Q6H  . vitamin C  500 mg Oral Daily  . enoxaparin (LOVENOX) injection  55 mg Subcutaneous Q24H  . hydrOXYzine  25 mg Oral QHS,MR X 1  . insulin aspart  0-9 Units Subcutaneous TID WC  .  methylPREDNISolone (SOLU-MEDROL) injection  0.5 mg/kg Intravenous Q12H   Followed by  . [START ON 01/31/2020] predniSONE  50 mg Oral Daily  . nicotine  21 mg Transdermal Daily  . sodium chloride flush  3 mL Intravenous Q12H  . zinc sulfate  220 mg Oral Daily   Continuous Infusions: . sodium chloride    . remdesivir 100 mg in NS 100 mL 100 mg (01/30/20 0923)     LOS: 2 days   Time spent: 37 mins   Margaret Florer Laural BenesJohnson, MD How to contact the [facility] Attending or Consulting provider 7A - 7P or covering provider during after hours 7P -7A, for this patient?  1. Check the care team in [facility] and look for a) attending/consulting [facility] provider listed and b) the [facility] team listed 2. Log into [email or web address] and use [facility]'s universal password to access. If you do not have the password, please contact the hospital operator. 3. Locate the [facility] provider you are looking for under [facility] and page to a number that you can be directly reached. 4. If you still have difficulty reaching the provider, please page the [facility] (Director [facility] Call) for the Hospitalists listed on amion for assistance.  01/30/2020, 11:25 AM

## 2020-01-31 DIAGNOSIS — Q800 Ichthyosis vulgaris: Secondary | ICD-10-CM

## 2020-01-31 DIAGNOSIS — E11628 Type 2 diabetes mellitus with other skin complications: Secondary | ICD-10-CM | POA: Diagnosis present

## 2020-01-31 DIAGNOSIS — I313 Pericardial effusion (noninflammatory): Secondary | ICD-10-CM | POA: Diagnosis present

## 2020-01-31 DIAGNOSIS — R7303 Prediabetes: Secondary | ICD-10-CM | POA: Diagnosis present

## 2020-01-31 DIAGNOSIS — I3139 Other pericardial effusion (noninflammatory): Secondary | ICD-10-CM | POA: Diagnosis present

## 2020-01-31 LAB — COMPREHENSIVE METABOLIC PANEL
ALT: 76 U/L — ABNORMAL HIGH (ref 0–44)
AST: 62 U/L — ABNORMAL HIGH (ref 15–41)
Albumin: 3.7 g/dL (ref 3.5–5.0)
Alkaline Phosphatase: 67 U/L (ref 38–126)
Anion gap: 10 (ref 5–15)
BUN: 17 mg/dL (ref 6–20)
CO2: 27 mmol/L (ref 22–32)
Calcium: 8.7 mg/dL — ABNORMAL LOW (ref 8.9–10.3)
Chloride: 101 mmol/L (ref 98–111)
Creatinine, Ser: 0.61 mg/dL (ref 0.44–1.00)
GFR, Estimated: >60 mL/min (ref >60)
Glucose, Bld: 171 mg/dL — ABNORMAL HIGH (ref 70–99)
Potassium: 4.2 mmol/L (ref 3.5–5.1)
Sodium: 138 mmol/L (ref 135–145)
Total Bilirubin: 0.4 mg/dL (ref 0.3–1.2)
Total Protein: 7.8 g/dL (ref 6.5–8.1)

## 2020-01-31 LAB — CBC WITH DIFFERENTIAL/PLATELET
Abs Immature Granulocytes: 0.04 10*3/uL (ref 0.00–0.07)
Basophils Absolute: 0 10*3/uL (ref 0.0–0.1)
Basophils Relative: 0 %
Eosinophils Absolute: 0 10*3/uL (ref 0.0–0.5)
Eosinophils Relative: 0 %
HCT: 46 % (ref 36.0–46.0)
Hemoglobin: 14.8 g/dL (ref 12.0–15.0)
Immature Granulocytes: 0 %
Lymphocytes Relative: 11 %
Lymphs Abs: 1 10*3/uL (ref 0.7–4.0)
MCH: 31.6 pg (ref 26.0–34.0)
MCHC: 32.2 g/dL (ref 30.0–36.0)
MCV: 98.1 fL (ref 80.0–100.0)
Monocytes Absolute: 0.7 10*3/uL (ref 0.1–1.0)
Monocytes Relative: 7 %
Neutro Abs: 7.6 10*3/uL (ref 1.7–7.7)
Neutrophils Relative %: 82 %
Platelets: 274 10*3/uL (ref 150–400)
RBC: 4.69 MIL/uL (ref 3.87–5.11)
RDW: 14.4 % (ref 11.5–15.5)
WBC: 9.3 10*3/uL (ref 4.0–10.5)
nRBC: 0 % (ref 0.0–0.2)

## 2020-01-31 LAB — D-DIMER, QUANTITATIVE: D-Dimer, Quant: 0.54 ug/mL-FEU — ABNORMAL HIGH (ref 0.00–0.50)

## 2020-01-31 LAB — GLUCOSE, CAPILLARY
Glucose-Capillary: 153 mg/dL — ABNORMAL HIGH (ref 70–99)
Glucose-Capillary: 149 mg/dL — ABNORMAL HIGH (ref 70–99)

## 2020-01-31 LAB — C-REACTIVE PROTEIN: CRP: 0.6 mg/dL (ref <1.0)

## 2020-01-31 NOTE — Progress Notes (Addendum)
PROGRESS NOTE   Margaret Weber  NFA:[id number] DOB: [birth year] DOA: 01/28/2020 PCP: Patient, No Pcp Per   Chief Complaint  Patient presents with  . Weakness   Brief Admission History:  59 y.o. female with medical history significant of morbid obesity, ongoing tobacco abuse, poor mobility and essentially bedbound over the last year, chronic lower extremity swelling, does not see doctor regularly so no defined medical problems, on no medications at home comes to the hospital with complaints of shortness of breath.  Patient lives with her fianc and he had a URI couple weeks ago.  Ever since, patient also felt URI type symptoms, initially started as a mild sore throat but then progressed to muscle aches, generalized fatigue and eventually shortness of breath over the last couple of days.  She denies any loss of taste or smell.  She reports chills occasionally.  No abdominal pain, but she did have nausea and vomiting.  No diarrhea. She has been complaining of a cough but this is somewhat chronic for her.  ED Course: In the ED she is afebrile 98.6, tachypneic with respiratory rate 23, normotensive.  She was satting in the mid 80s on room air requiring 3 L supplemental oxygen.  Blood work shows normal CMP with a glucose of 114, BNP is normal, high-sensitivity troponin normal, CRP mildly elevated 2.3, negative procalcitonin and lactic acid of 1.2.  CBC is unremarkable.  Chest x-ray with small pleural effusion left base atelectasis.  She tested positive for COVID-19, and due to hypoxia we are asked to admit  Assessment & Plan:   Active Problems:   Acute hypoxemic respiratory failure due to COVID-19 [facility])   Tobacco abuse   Self neglect   COVID-19   Hypoxia   Pleural effusion   Prediabetes   small pericardial effusion   Ichthyosis vulgaris  Acute respiratory failure with hypoxia secondary to covid pneumonia. Pt remains on supplemental oxygen 3L/min with no escalation of oxygen requirement. I  suspect she has previously undiagnosed chronic respiratory failure from her long history of smoking and will need long-term home oxygen.  Continue current management for Covid infection as patient is unvaccinated.  I requested APS referral for self neglect and poor self care. Given patient has poor mobility and no transportation it is not feasible to arrange for outpatient remdesivir infusions.  Pt will need to complete remdesivir in the hospital and I am anticipating discharge 12/5 after she completes final remdesivir treatment. TOC telling me that they will be able to arrange charity home oxygen and charity home health services.    BLE edema  - chronic venous stasis BLEs - 2D echocardiogram LVEF 50-55%, normal diastolic parameters and small pericardial effusion.  Venous doppler BLEs negative.   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  I have asked TOC team to assist with obtaining primary care services for patient.   Chronic active nicotine dependence - nicotine patch ordered, counseled on smoking cessation.  Pt counseled she will not be able to smoke while wearing supplemental oxygen.   Prediabetes - diet controlled.   DVT prophylaxis: enoxaparin Code Status: full Family Communication: fiance Nadine Counts was updated 12/3 by telephone with patient's permission Disposition: home with charity HH  Status is: Inpatient  Remains inpatient appropriate because:Inpatient level of care appropriate due to severity of illness  Dispo: The patient is from: Home  Anticipated d/c is to: Home              Anticipated d/c date is: 1 day              Patient currently is not medically stable to d/c.  Pt will complete final dose of remdesivir 12/5 and hopefully can discharge home with charity home oxygen and charity home health services.    Consultants:   N/a  Procedures:    n/a  Antimicrobials:  remdesivir 12/1>>  Subjective: Pt reports that her breathing is markedly improved.    Objective: Vitals:   01/30/20 0649 01/30/20 1836 01/30/20 2131 01/31/20 0625  BP: 117/74 140/79 134/77 140/79  Pulse: 82 71 70 84  Resp: 20 20 20 20   Temp: 98.2 F (36.8 C) (!) 97.5 F (36.4 C) 97.7 F (36.5 C) 97.7 F (36.5 C)  TempSrc: Oral Oral Oral Oral  SpO2: 94% 93% 96% 94%  Weight:      Height:        Intake/Output Summary (Last 24 hours) at 01/31/2020 [id number] Last data filed at 01/31/2020 [id number] Gross per 24 hour  Intake 890 ml  Output 1850 ml  Net -960 ml   Filed Weights   01/28/20 1243  Weight: 113.4 kg   Examination:  General exam: morbidly obese female, poorly groomed, Appears calm and comfortable  Respiratory system: Clear to auscultation. Respiratory effort normal. Cardiovascular system: S1 & S2 heard, RRR. No JVD, murmurs, rubs, gallops or clicks. No pedal edema. Gastrointestinal system: Abdomen is nondistended, soft and nontender. No organomegaly or masses felt. Normal bowel sounds heard. Central nervous system: Alert and oriented. No focal neurological deficits. Extremities: Symmetric 5 x 5 power. Skin: severe ichthyosis BLEs, feet warm bilateral. Edema BLEs, chronic venous stasis changes.  Psychiatry:  Mood & affect flat.   Data Reviewed: I have personally reviewed following labs and imaging studies  CBC: Recent Labs  Lab 01/28/20 1434 01/29/20 0652 01/30/20 0956 01/31/20 0633  WBC 6.3 5.2 9.1 9.3  NEUTROABS 4.5 4.2 7.1 7.6  HGB 14.1 14.3 14.1 14.8  HCT 44.7 45.4 44.4 46.0  MCV 99.3 100.9* 98.9 98.1  PLT 248 239 263 274    Basic Metabolic Panel: Recent Labs  Lab 01/28/20 1434 01/29/20 0652 01/30/20 0956 01/31/20 0633  NA 136 137 138 138  K 3.9 4.0 3.9 4.2  CL 99 99 101 101  CO2 27 29 26 27   GLUCOSE 114* 151* 134* 171*  BUN 11 11 17 17   CREATININE 0.82 0.77 0.68 0.61  CALCIUM 9.0 8.5* 8.5* 8.7*    GFR: Estimated  Creatinine Clearance: 92.3 mL/min (by C-G formula based on SCr of 0.61 mg/dL).  Liver Function Tests: Recent Labs  Lab 01/28/20 1434 01/29/20 0652 01/30/20 0956 01/31/20 0633  AST 20 30 27  62*  ALT 25 26 27  76*  ALKPHOS 71 71 64 67  BILITOT 0.4 0.4 0.6 0.4  PROT 8.0 8.2* 7.7 7.8  ALBUMIN 4.1 3.9 3.8 3.7    CBG: Recent Labs  Lab 01/29/20 2023 01/30/20 0918 01/30/20 1124 01/30/20 1740 01/30/20 2130  GLUCAP 138* 139* 144* 148* 160*    Recent Results (from the past 240 hour(s))  Resp Panel by RT-PCR (Flu A&B, Covid) Nasopharyngeal Swab     Status: Abnormal   Collection Time: 01/28/20 12:41 PM   Specimen: Nasopharyngeal Swab; Nasopharyngeal(NP) swabs in vial transport medium  Result Value Ref Range Status   SARS Coronavirus 2 by RT PCR POSITIVE (A) NEGATIVE Final  Comment: RESULT CALLED TO, READ BACK BY AND VERIFIED WITH: BRAME M AT 1426 BY THOMPSON S.ON [id number] (NOTE) SARS-CoV-2 target nucleic acids are DETECTED.  The SARS-CoV-2 RNA is generally detectable in upper respiratory specimens during the acute phase of infection. Positive results are indicative of the presence of the identified virus, but do not rule out bacterial infection or co-infection with other pathogens not detected by the test. Clinical correlation with patient history and other diagnostic information is necessary to determine patient infection status. The expected result is Negative.  Fact Sheet for Patients: BloggerCourse.com  Fact Sheet for Healthcare Providers: SeriousBroker.it  This test is not yet approved or cleared by the [place] FDA and  has been authorized for detection and/or diagnosis of SARS-CoV-2 by FDA under an Emergency Use Authorization (EUA).  This EUA will remain in effect (meaning this test  can be used) for the duration of  the COVID-19 declaration under Section 564(b)(1) of the Act, 21 U.S.C. section 360bbb-3(b)(1),  unless the authorization is terminated or revoked sooner.     Influenza A by PCR NEGATIVE NEGATIVE Final   Influenza B by PCR NEGATIVE NEGATIVE Final    Comment: (NOTE) The Xpert Xpress SARS-CoV-2/FLU/RSV plus assay is intended as an aid in the diagnosis of influenza from Nasopharyngeal swab specimens and should not be used as a sole basis for treatment. Nasal washings and aspirates are unacceptable for Xpert Xpress SARS-CoV-2/FLU/RSV testing.  Fact Sheet for Patients: BloggerCourse.com  Fact Sheet for Healthcare Providers: SeriousBroker.it  This test is not yet approved or cleared by the [place] FDA and has been authorized for detection and/or diagnosis of SARS-CoV-2 by FDA under an Emergency Use Authorization (EUA). This EUA will remain in effect (meaning this test can be used) for the duration of the COVID-19 declaration under Section 564(b)(1) of the Act, 21 U.S.C. section 360bbb-3(b)(1), unless the authorization is terminated or revoked.  Performed at [facility], [address], [place], [place] [address]   Blood Culture (routine x 2)     Status: None (Preliminary result)   Collection Time: 01/28/20  3:30 PM   Specimen: BLOOD RIGHT HAND  Result Value Ref Range Status   Specimen Description BLOOD RIGHT HAND  Final   Special Requests   Final    BOTTLES DRAWN AEROBIC AND ANAEROBIC Blood Culture results may not be optimal due to an inadequate volume of blood received in culture bottles   Culture   Final    NO GROWTH 3 DAYS Performed at [facility], [address], [place], [place] [address]    Report Status PENDING  Incomplete  Blood Culture (routine x 2)     Status: None (Preliminary result)   Collection Time: 01/28/20  3:30 PM   Specimen: Left Antecubital; Blood  Result Value Ref Range Status   Specimen Description LEFT ANTECUBITAL  Final   Special Requests   Final    BOTTLES DRAWN AEROBIC AND ANAEROBIC Blood  Culture results may not be optimal due to an inadequate volume of blood received in culture bottles   Culture   Final    NO GROWTH 3 DAYS Performed at [facility], [address], [place], [place] [address]    Report Status PENDING  Incomplete     Radiology Studies: US Venous Img Lower Bilateral (DVT)  Result Date: 01/30/2020 CLINICAL DATA:  Bilateral pain and swelling EXAM: BILATERAL LOWER EXTREMITY VENOUS DOPPLER ULTRASOUND TECHNIQUE: Gray-scale sonography with compression, as well as color and duplex ultrasound, were performed to  evaluate the deep venous system(s) from the level of the common femoral vein through the popliteal and proximal calf veins. COMPARISON:  None. FINDINGS: VENOUS Normal compressibility of the common femoral, superficial femoral, and popliteal veins, as well as the visualized calf veins. Visualized portions of profunda femoral vein and great saphenous vein unremarkable. No filling defects to suggest DVT on grayscale or color Doppler imaging. Doppler waveforms show normal direction of venous flow, normal respiratory plasticity and response to augmentation. OTHER None. Limitations: none IMPRESSION: Negative. Electronically Signed   By: Katherine Mantle M.D.   On: 01/30/2020 18:39   ECHOCARDIOGRAM COMPLETE  Result Date: 01/30/2020    ECHOCARDIOGRAM REPORT   Patient Name:   Margaret Weber Date of Exam: 01/30/2020 Medical Rec #:  [id number]       Height:       64.0 in Accession #:    [id number]      Weight:       250.0 lb Date of Birth:  April 06, 1960       BSA:          2.151 m Patient Age:    59 years        BP:           117/74 mmHg Patient Gender: F               HR:           82 bpm. [facility] Procedure: 2D Echo Indications:    Bilateral leg edema [[id number]]  History:        Patient has no prior history of Echocardiogram examinations.                 Risk Factors:Current Smoker. Covid 19, Self neglect, Acute                 hypoxemic respiratory failure due to  COVID-19 , Pleural                 effusion.  Sonographer:    Jeryl Columbia RDCS (AE) Referring Phys: [id number] Savannha Welle L Torianne Laflam IMPRESSIONS  1. Left ventricular ejection fraction, by estimation, is 55 to 60%. The left ventricle has normal function. The left ventricle has no regional wall motion abnormalities. There is mild left ventricular hypertrophy. Left ventricular diastolic parameters were normal.  2. Right ventricular systolic function is normal. The right ventricular size is normal. Tricuspid regurgitation signal is inadequate for assessing PA pressure.  3. A small pericardial effusion is present. The pericardial effusion is anterior to the right ventricle.  4. The mitral valve is grossly normal. Trivial mitral valve regurgitation.  5. The aortic valve is tricuspid. There is mild calcification of the aortic valve. Aortic valve regurgitation is not visualized.  6. The inferior vena cava is dilated in size with <50% respiratory variability, suggesting right atrial pressure of 15 mmHg. FINDINGS  Left Ventricle: Left ventricular ejection fraction, by estimation, is 55 to 60%. The left ventricle has normal function. The left ventricle has no regional wall motion abnormalities. The left ventricular internal cavity size was normal in size. There is  mild left ventricular hypertrophy. Left ventricular diastolic parameters were normal. Right Ventricle: The right ventricular size is normal. No increase in right ventricular wall thickness. Right ventricular systolic function is normal. Tricuspid regurgitation signal is inadequate for assessing PA pressure. Left Atrium: Left atrial size was normal in size. Right Atrium: Right atrial size was normal in size. Pericardium: A small pericardial effusion is  present. The pericardial effusion is anterior to the right ventricle. Presence of pericardial fat pad. Mitral Valve: The mitral valve is grossly normal. Trivial mitral valve regurgitation. Tricuspid Valve: The tricuspid  valve is grossly normal. Tricuspid valve regurgitation is trivial. Aortic Valve: The aortic valve is tricuspid. There is mild calcification of the aortic valve. Aortic valve regurgitation is not visualized. Pulmonic Valve: The pulmonic valve was grossly normal. Pulmonic valve regurgitation is trivial. Aorta: The aortic root is normal in size and structure. Venous: The inferior vena cava is dilated in size with less than 50% respiratory variability, suggesting right atrial pressure of 15 mmHg. IAS/Shunts: No atrial level shunt detected by color flow Doppler.  LEFT VENTRICLE PLAX 2D LVIDd:         4.55 cm  Diastology LVIDs:         3.24 cm  LV e' medial:    7.18 cm/s LV PW:         1.29 cm  LV E/e' medial:  7.7 LV IVS:        1.09 cm  LV e' lateral:   9.25 cm/s LVOT diam:     1.90 cm  LV E/e' lateral: 6.0 LVOT Area:     2.84 cm  RIGHT VENTRICLE RV S prime:     12.30 cm/s TAPSE (M-mode): 1.8 cm LEFT ATRIUM             Index       RIGHT ATRIUM           Index LA diam:        3.50 cm 1.63 cm/m  RA Area:     13.80 cm LA Vol (A2C):   30.1 ml 13.99 ml/m RA Volume:   35.20 ml  16.36 ml/m LA Vol (A4C):   18.5 ml 8.60 ml/m LA Biplane Vol: 24.3 ml 11.30 ml/m   AORTA Ao Root diam: 2.70 cm MITRAL VALVE MV Area (PHT): 2.80 cm    SHUNTS MV Decel Time: 271 msec    Systemic Diam: 1.90 cm MV E velocity: 55.30 cm/s MV A velocity: 62.60 cm/s MV E/A ratio:  0.88 Nona Dell MD Electronically signed by Nona Dell MD Signature Date/Time: 01/30/2020/11:27:01 AM    Final    Scheduled Meds: . acetaminophen  650 mg Oral Q6H  . vitamin C  500 mg Oral Daily  . enoxaparin (LOVENOX) injection  55 mg Subcutaneous Q24H  . hydrOXYzine  25 mg Oral QHS,MR X 1  . insulin aspart  0-9 Units Subcutaneous TID WC  . nicotine  21 mg Transdermal Daily  . predniSONE  50 mg Oral Daily  . sodium chloride flush  3 mL Intravenous Q12H  . zinc sulfate  220 mg Oral Daily   Continuous Infusions: . sodium chloride    . remdesivir 100 mg  in NS 100 mL 100 mg (01/30/20 0923)     LOS: 3 days   Time spent: 35 mins   Marla Pouliot Laural Benes, MD How to contact the [facility] Attending or Consulting provider 7A - 7P or covering provider during after hours 7P -7A, for this patient?  1. Check the care team in [facility] and look for a) attending/consulting TR[facility] provider listed and b) the [facility] team listed 2. Log into [email or web address] and use [facility]'s universal password to access. If you do not have the password, please contact the hospital operator. 3. Locate the [facility] provider you are looking for under [facility] and page to a number that you can be directly  reached. 4. If you still have difficulty reaching the provider, please page the [facility] (Director on Call) for the Hospitalists listed on amion for assistance.  01/31/2020, 8:38 AM

## 2020-02-01 DIAGNOSIS — J441 Chronic obstructive pulmonary disease with (acute) exacerbation: Secondary | ICD-10-CM

## 2020-02-01 DIAGNOSIS — I313 Pericardial effusion (noninflammatory): Secondary | ICD-10-CM

## 2020-02-01 DIAGNOSIS — R7303 Prediabetes: Secondary | ICD-10-CM

## 2020-02-01 DIAGNOSIS — Z720 Tobacco use: Secondary | ICD-10-CM

## 2020-02-01 LAB — GLUCOSE, CAPILLARY
Glucose-Capillary: 106 mg/dL — ABNORMAL HIGH (ref 70–99)
Glucose-Capillary: 182 mg/dL — ABNORMAL HIGH (ref 70–99)
Glucose-Capillary: 218 mg/dL — ABNORMAL HIGH (ref 70–99)
Glucose-Capillary: 230 mg/dL — ABNORMAL HIGH (ref 70–99)

## 2020-02-01 MED ORDER — ARFORMOTEROL TARTRATE 15 MCG/2ML IN NEBU
15.0000 ug | INHALATION_SOLUTION | Freq: Two times a day (BID) | RESPIRATORY_TRACT | Status: DC
  Administered 2020-02-01 – 2020-02-02 (×3): 15 ug via RESPIRATORY_TRACT
  Filled 2020-02-01 (×4): qty 2

## 2020-02-01 MED ORDER — IPRATROPIUM-ALBUTEROL 20-100 MCG/ACT IN AERS
1.0000 | INHALATION_SPRAY | Freq: Four times a day (QID) | RESPIRATORY_TRACT | Status: DC
  Administered 2020-02-01 – 2020-02-02 (×5): 1 via RESPIRATORY_TRACT
  Filled 2020-02-01: qty 4

## 2020-02-01 MED ORDER — METHYLPREDNISOLONE SODIUM SUCC 125 MG IJ SOLR
60.0000 mg | Freq: Two times a day (BID) | INTRAMUSCULAR | Status: DC
  Administered 2020-02-01 – 2020-02-02 (×3): 60 mg via INTRAVENOUS
  Filled 2020-02-01 (×3): qty 2

## 2020-02-01 NOTE — Progress Notes (Signed)
PROGRESS NOTE  Margaret Weber XNA:[id number] DOB: [birth year] DOA: 01/28/2020 PCP: Patient, No Pcp Per  Brief History:  59 y.o. female with medical history significant of morbid obesity, ongoing tobacco abuse, poor mobility and essentially bedbound over the last year, chronic lower extremity swelling, does not see doctor regularly so no defined medical problems, on no medications at home comes to the hospital with complaints of shortness of breath.  Patient lives with her fianc and he had a URI couple weeks ago.  Ever since, patient also felt URI type symptoms, initially started as a mild sore throat but then progressed to muscle aches, generalized fatigue and eventually shortness of breath over the last couple of days.  She denies any loss of taste or smell.  She reports chills occasionally.  No abdominal pain, but she did have nausea and vomiting.  No diarrhea. She has been complaining of a cough but this is somewhat chronic for her.  ED Course: In the ED she is afebrile 98.6, tachypneic with respiratory rate 23, normotensive.  She was satting in the mid 80s on room air requiring 3 L supplemental oxygen.  Blood work shows normal CMP with a glucose of 114, BNP is normal, high-sensitivity troponin normal, CRP mildly elevated 2.3, negative procalcitonin and lactic acid of 1.2.  CBC is unremarkable.  Chest x-ray with small pleural effusion left base atelectasis.  She tested positive for COVID-19, and due to hypoxia [facility] asked to admit  Assessment/Plan: Acute hypoxic respiratory failure due to COVID-19 viral pneumonia -finished remdesivir 02/01/20 -CRP 2.3>>0.7>>0.6 -Ddimer 0.79>>0.67>>0.54 -restart IV steroids -Incentive spirometry, flutter valve, proning as able -vitamin C and zinc -stable on 2L  COPD Exacerbation -start combivent -restart IV steroids  Immobility, deconditioning -Get PT consult>>>SNF  Morbid obesity -Patient would benefit from weight loss  Tobacco abuse  -Provide nicotine patch -tobacco cessation discussed  Chronic venous stasis, chronic lower extremity edema -venous duplex--neg -12/3 Echo--EF 55-60%, no WMA, small pericardial effusion, trivial MR  Impaired Glucose Tolerance -A1C--6.2      Status is: Inpatient  Remains inpatient appropriate because:IV treatments appropriate due to intensity of illness or inability to take PO   Dispo:  Patient From: Home  Planned Disposition: Home with Health Care Svc  Expected discharge date: 02/02/20  Medically stable for discharge: No         Family Communication:  no Family at bedside  Consultants:  none  Code Status:  FULL   DVT Prophylaxis: [place] Lovenox   Procedures: As Listed in Progress Note Above  Antibiotics: None     Subjective: Patient complains of nonproductive cough.  Complains of dyspnea on exertion.  Denies f/c, cp, n/v/d, abd pain.  Objective: Vitals:   01/31/20 1742 01/31/20 2001 01/31/20 2025 02/01/20 0555  BP: 140/81 (!) 153/87  (!) 116/51  Pulse: 73 69  67  Resp: 20 20  20   Temp: 97.8 F (36.6 C) 97.7 F (36.5 C)  (!) 97.5 F (36.4 C)  TempSrc: Oral Oral  Oral  SpO2: 94% 94% 94% 93%  Weight:      Height:        Intake/Output Summary (Last 24 hours) at 02/01/2020 1226 Last data filed at 02/01/2020 0900 Gross per 24 hour  Intake 480 ml  Output -  Net 480 ml   Weight change:  Exam:   General:  Pt is alert, follows commands appropriately, not in acute distress  HEENT: No icterus, No thrush, No neck  mass, [name]  Cardiovascular: RRR, S1/S2, no rubs, no gallops  Respiratory: bilateral expiratory wheeze.  Bibasilar rales  Abdomen: Soft/+BS, non tender, non distended, no guarding  Extremities: No edema, No lymphangitis, No petechiae, No rashes, no synovitis   Data Reviewed: I have personally reviewed following labs and imaging studies Basic Metabolic Panel: Recent Labs  Lab 01/28/20 1434 01/29/20 0652 01/30/20 0956 01/31/20  0633  NA 136 137 138 138  K 3.9 4.0 3.9 4.2  CL 99 99 101 101  CO2 27 29 26 27   GLUCOSE 114* 151* 134* 171*  BUN 11 11 17 17   CREATININE 0.82 0.77 0.68 0.61  CALCIUM 9.0 8.5* 8.5* 8.7*   Liver Function Tests: Recent Labs  Lab 01/28/20 1434 01/29/20 0652 01/30/20 0956 01/31/20 0633  AST 20 30 27  62*  ALT 25 26 27  76*  ALKPHOS 71 71 64 67  BILITOT 0.4 0.4 0.6 0.4  PROT 8.0 8.2* 7.7 7.8  ALBUMIN 4.1 3.9 3.8 3.7   No results for input(s): LIPASE, AMYLASE in the last 168 hours. No results for input(s): AMMONIA in the last 168 hours. Coagulation Profile: No results for input(s): INR, PROTIME in the last 168 hours. CBC: Recent Labs  Lab 01/28/20 1434 01/29/20 0652 01/30/20 0956 01/31/20 0633  WBC 6.3 5.2 9.1 9.3  NEUTROABS 4.5 4.2 7.1 7.6  HGB 14.1 14.3 14.1 14.8  HCT 44.7 45.4 44.4 46.0  MCV 99.3 100.9* 98.9 98.1  PLT 248 239 263 274   Cardiac Enzymes: No results for input(s): CKTOTAL, CKMB, CKMBINDEX, TROPONINI in the last 168 hours. BNP: Invalid input(s): POCBNP CBG: Recent Labs  Lab 01/30/20 1740 01/30/20 2130 01/31/20 1253 01/31/20 1744 02/01/20 0858  GLUCAP 148* 160* 153* 149* 106*   HbA1C: No results for input(s): HGBA1C in the last 72 hours. Urine analysis:    Component Value Date/Time   COLORURINE YELLOW 01/29/2020 0035   APPEARANCEUR CLEAR 01/29/2020 0035   LABSPEC 1.016 01/29/2020 0035   PHURINE 5.0 01/29/2020 0035   GLUCOSEU NEGATIVE 01/29/2020 0035   HGBUR MODERATE (A) 01/29/2020 0035   BILIRUBINUR NEGATIVE 01/29/2020 0035   KETONESUR NEGATIVE 01/29/2020 0035   PROTEINUR NEGATIVE 01/29/2020 0035   NITRITE NEGATIVE 01/29/2020 0035   LEUKOCYTESUR TRACE (A) 01/29/2020 0035   Sepsis Labs: @LABRCNTIP (procalcitonin:4,lacticidven:4) ) Recent Results (from the past 240 hour(s))  Resp Panel by RT-PCR (Flu A&B, Covid) Nasopharyngeal Swab     Status: Abnormal   Collection Time: 01/28/20 12:41 PM   Specimen: Nasopharyngeal Swab;  Nasopharyngeal(NP) swabs in vial transport medium  Result Value Ref Range Status   SARS Coronavirus 2 by RT PCR POSITIVE (A) NEGATIVE Final    Comment: RESULT CALLED TO, READ BACK BY AND VERIFIED WITH: BRAME M AT 1426 BY THOMPSON S.ON 14/03/2019 (NOTE) SARS-CoV-2 target nucleic acids are DETECTED.  The SARS-CoV-2 RNA is generally detectable in upper respiratory specimens during the acute phase of infection. Positive results are indicative of the presence of the identified virus, but do not rule out bacterial infection or co-infection with other pathogens not detected by the test. Clinical correlation with patient history and other diagnostic information is necessary to determine patient infection status. The expected result is Negative.  Fact Sheet for Patients: 14/03/2019  Fact Sheet for Healthcare Providers: 14/03/2019  This test is not yet approved or cleared by the FDA and  has been authorized for detection and/or diagnosis of SARS-CoV-2 by FDA under an Emergency Use Authorization (EUA).  This EUA will remain in effect (meaning  this test  can be used) for the duration of  the COVID-19 declaration under Section 564(b)(1) of the Act, 21 U.S.C. section 360bbb-3(b)(1), unless the authorization is terminated or revoked sooner.     Influenza A by PCR NEGATIVE NEGATIVE Final   Influenza B by PCR NEGATIVE NEGATIVE Final    Comment: (NOTE) The Xpert Xpress SARS-CoV-2/FLU/RSV plus assay is intended as an aid in the diagnosis of influenza from Nasopharyngeal swab specimens and should not be used as a sole basis for treatment. Nasal washings and aspirates are unacceptable for Xpert Xpress SARS-CoV-2/FLU/RSV testing.  Fact Sheet for Patients: BloggerCourse.com  Fact Sheet for Healthcare Providers: SeriousBroker.it  This test is not yet approved or cleared by  the [place] FDA and has been authorized for detection and/or diagnosis of SARS-CoV-2 by FDA under an Emergency Use Authorization (EUA). This EUA will remain in effect (meaning this test can be used) for the duration of the COVID-19 declaration under Section 564(b)(1) of the Act, 21 U.S.C. section 360bbb-3(b)(1), unless the authorization is terminated or revoked.  Performed at [facility], [address] [place], [place] [address]   Blood Culture (routine x 2)     Status: None (Preliminary result)   Collection Time: 01/28/20  3:30 PM   Specimen: BLOOD RIGHT HAND  Result Value Ref Range Status   Specimen Description BLOOD RIGHT HAND  Final   Special Requests   Final    BOTTLES DRAWN AEROBIC AND ANAEROBIC Blood Culture results may not be optimal due to an inadequate volume of blood received in culture bottles   Culture   Final    NO GROWTH 4 DAYS Performed at [facility], [address], [place], [place] [address]    Report Status PENDING  Incomplete  Blood Culture (routine x 2)     Status: None (Preliminary result)   Collection Time: 01/28/20  3:30 PM   Specimen: Left Antecubital; Blood  Result Value Ref Range Status   Specimen Description LEFT ANTECUBITAL  Final   Special Requests   Final    BOTTLES DRAWN AEROBIC AND ANAEROBIC Blood Culture results may not be optimal due to an inadequate volume of blood received in culture bottles   Culture   Final    NO GROWTH 4 DAYS Performed at [facility], [address] [place], [place] [address]    Report Status PENDING  Incomplete     Scheduled Meds: . acetaminophen  650 mg Oral Q6H  . vitamin C  500 mg Oral Daily  . enoxaparin (LOVENOX) injection  55 mg Subcutaneous Q24H  . hydrOXYzine  25 mg Oral QHS,MR X 1  . insulin aspart  0-9 Units Subcutaneous TID WC  . nicotine  21 mg Transdermal Daily  . predniSONE  50 mg Oral Daily  . sodium chloride flush  3 mL Intravenous Q12H  . zinc sulfate  220 mg Oral Daily   Continuous  Infusions: . sodium chloride      Procedures/Studies: US Venous Img Lower Bilateral (DVT)  Result Date: 01/30/2020 CLINICAL DATA:  Bilateral pain and swelling EXAM: BILATERAL LOWER EXTREMITY VENOUS DOPPLER ULTRASOUND TECHNIQUE: Gray-scale sonography with compression, as well as color and duplex ultrasound, were performed to evaluate the deep venous system(s) from the level of the common femoral vein through the popliteal and proximal calf veins. COMPARISON:  None. FINDINGS: VENOUS Normal compressibility of the common femoral, superficial femoral, and popliteal veins, as well as the visualized calf veins. Visualized portions of profunda femoral vein and great  saphenous vein unremarkable. No filling defects to suggest DVT on grayscale or color Doppler imaging. Doppler waveforms show normal direction of venous flow, normal respiratory plasticity and response to augmentation. OTHER None. Limitations: none IMPRESSION: Negative. Electronically Signed   By: Katherine Mantle M.D.   On: 01/30/2020 18:39   DG Chest Portable 1 View  Result Date: 01/28/2020 CLINICAL DATA:  Shortness of breath EXAM: PORTABLE CHEST 1 VIEW COMPARISON:  None. FINDINGS: There is a small left pleural effusion. There is atelectatic change in the left base. There is no appreciable edema or consolidation. Relative increased opacity in the right base region is felt to be due to overlying breast tissue. Heart is upper normal in size with pulmonary vascularity normal. No adenopathy. No bone lesions. IMPRESSION: Small left pleural effusion with left base atelectasis. No edema or airspace consolidation. Heart upper normal in size. Electronically Signed   By: Bretta Bang III M.D.   On: 01/28/2020 13:06   ECHOCARDIOGRAM COMPLETE  Result Date: 01/30/2020    ECHOCARDIOGRAM REPORT   Patient Name:   Margaret Weber Date of Exam: 01/30/2020 Medical Rec #:  [id number]       Height:       64.0 in Accession #:    [id number]      Weight:        250.0 lb Date of Birth:  [birth year]       BSA:          2.151 m Patient Age:    59 years        BP:           117/74 mmHg Patient Gender: F               HR:           82 bpm. [facility] Procedure: 2D Echo Indications:    Bilateral leg edema [[id number]]  History:        Patient has no prior history of Echocardiogram examinations.                 Risk Factors:Current Smoker. Covid 19, Self neglect, Acute                 hypoxemic respiratory failure due to COVID-19 , Pleural                 effusion.  Sonographer:    Jeryl Columbia RDCS (AE) Referring Phys: [id number] CLANFORD L JOHNSON IMPRESSIONS  1. Left ventricular ejection fraction, by estimation, is 55 to 60%. The left ventricle has normal function. The left ventricle has no regional wall motion abnormalities. There is mild left ventricular hypertrophy. Left ventricular diastolic parameters were normal.  2. Right ventricular systolic function is normal. The right ventricular size is normal. Tricuspid regurgitation signal is inadequate for assessing PA pressure.  3. A small pericardial effusion is present. The pericardial effusion is anterior to the right ventricle.  4. The mitral valve is grossly normal. Trivial mitral valve regurgitation.  5. The aortic valve is tricuspid. There is mild calcification of the aortic valve. Aortic valve regurgitation is not visualized.  6. The inferior vena cava is dilated in size with <50% respiratory variability, suggesting right atrial pressure of 15 mmHg. FINDINGS  Left Ventricle: Left ventricular ejection fraction, by estimation, is 55 to 60%. The left ventricle has normal function. The left ventricle has no regional wall motion abnormalities. The left ventricular internal cavity size was normal in size. There is  mild  left ventricular hypertrophy. Left ventricular diastolic parameters were normal. Right Ventricle: The right ventricular size is normal. No increase in right ventricular wall thickness. Right  ventricular systolic function is normal. Tricuspid regurgitation signal is inadequate for assessing PA pressure. Left Atrium: Left atrial size was normal in size. Right Atrium: Right atrial size was normal in size. Pericardium: A small pericardial effusion is present. The pericardial effusion is anterior to the right ventricle. Presence of pericardial fat pad. Mitral Valve: The mitral valve is grossly normal. Trivial mitral valve regurgitation. Tricuspid Valve: The tricuspid valve is grossly normal. Tricuspid valve regurgitation is trivial. Aortic Valve: The aortic valve is tricuspid. There is mild calcification of the aortic valve. Aortic valve regurgitation is not visualized. Pulmonic Valve: The pulmonic valve was grossly normal. Pulmonic valve regurgitation is trivial. Aorta: The aortic root is normal in size and structure. Venous: The inferior vena cava is dilated in size with less than 50% respiratory variability, suggesting right atrial pressure of 15 mmHg. IAS/Shunts: No atrial level shunt detected by color flow Doppler.  LEFT VENTRICLE PLAX 2D LVIDd:         4.55 cm  Diastology LVIDs:         3.24 cm  LV e' medial:    7.18 cm/s LV PW:         1.29 cm  LV E/e' medial:  7.7 LV IVS:        1.09 cm  LV e' lateral:   9.25 cm/s LVOT diam:     1.90 cm  LV E/e' lateral: 6.0 LVOT Area:     2.84 cm  RIGHT VENTRICLE RV S prime:     12.30 cm/s TAPSE (M-mode): 1.8 cm LEFT ATRIUM             Index       RIGHT ATRIUM           Index LA diam:        3.50 cm 1.63 cm/m  RA Area:     13.80 cm LA Vol (A2C):   30.1 ml 13.99 ml/m RA Volume:   35.20 ml  16.36 ml/m LA Vol (A4C):   18.5 ml 8.60 ml/m LA Biplane Vol: 24.3 ml 11.30 ml/m   AORTA Ao Root diam: 2.70 cm MITRAL VALVE MV Area (PHT): 2.80 cm    SHUNTS MV Decel Time: 271 msec    Systemic Diam: 1.90 cm MV E velocity: 55.30 cm/s MV A velocity: 62.60 cm/s MV E/A ratio:  0.88 Nona DellSamuel Mcdowell MD Electronically signed by Nona DellSamuel Mcdowell MD Signature Date/Time:  01/30/2020/11:27:01 AM    Final     Catarina Hartshornavid Tat, DO  [facility]  If 7PM-7AM, please contact night-coverage [email or web address] Password [email or web address] 02/01/2020, 12:26 PM   LOS: 4 days

## 2020-02-01 NOTE — Progress Notes (Signed)
Stated wanted to get out of bed today but declined when offered help to do so, stating she needed to sleep

## 2020-02-02 LAB — GLUCOSE, CAPILLARY
Glucose-Capillary: 223 mg/dL — ABNORMAL HIGH (ref 70–99)
Glucose-Capillary: 188 mg/dL — ABNORMAL HIGH (ref 70–99)
Glucose-Capillary: 174 mg/dL — ABNORMAL HIGH (ref 70–99)

## 2020-02-02 LAB — CULTURE, BLOOD (ROUTINE X 2)
Culture: NO GROWTH
Culture: NO GROWTH

## 2020-02-02 MED ORDER — PREDNISONE 20 MG PO TABS
50.0000 mg | ORAL_TABLET | Freq: Two times a day (BID) | ORAL | Status: DC
  Administered 2020-02-02: 50 mg via ORAL
  Filled 2020-02-02: qty 1

## 2020-02-02 MED ORDER — PREDNISONE 50 MG PO TABS
50.0000 mg | ORAL_TABLET | Freq: Two times a day (BID) | ORAL | 0 refills | Status: DC
Start: 2020-02-02 — End: 2021-07-22

## 2020-02-02 NOTE — Progress Notes (Signed)
Pt discharged via stretcher by [facility] with her belongings in her possession. Pt's portable oxygen tank and carrier brought in by DME also sent home with pt.

## 2020-02-02 NOTE — Progress Notes (Signed)
Pt has been up in chair since 11am. Was able to stand with walker and standby assist and make 4 small steps from side of bed to recliner. Pt states she initially felt dizzy upon initial standing, but feeling passed and no further issues. Pt states she feels much better overall today and is excited to be going home. States her friend Jesusita Oka will be helping her at home. States she has a walker and a wheelchair to use for mobility.

## 2020-02-02 NOTE — Progress Notes (Signed)
SATURATION QUALIFICATIONS: (This note is used to comply with regulatory documentation for home oxygen)  Patient Saturations on Room Air at Rest = 88  Patient Saturations on Room Air while Ambulating = n/a  Patient Saturations on 2: Liters of oxygen while Ambulating = 94  Please briefly explain why patient needs home oxygen: To maintain 02 sat at 90% or above during ambulation.   Catarina Hartshorn, DO

## 2020-02-02 NOTE — Progress Notes (Signed)
Pt assisted to dress in disposable scrubs for discharge. Pt able to stand on her own using walker, states still feels weak but still much better than yesterday. Currently awaiting EMS transport, pt aware and states understanding.

## 2020-02-02 NOTE — TOC Transition Note (Signed)
Transition of Care [facility]) - CM/SW Discharge Note   Patient Details  Name: Margaret Weber MRN: [id number] Date of Birth: [birth year]  Transition of Care [facility] CM/SW Contact:  Karn Cassis, LCSW Phone Number: 02/02/2020, 4:22 PM   Clinical Narrative:  Pt d/c today. Pt states she is returning home and is very eager to leave as soon as possible. LCSW attempted to arrange charity home health PT, but pt was not accepted by [facility], [facility], or [facility]. LCSW offered outpatient PT, but pt refuses as it is private pay. She states she will have assistance from her fiance and a friend. Charity home O2 set up through [employer] and portable oxygen tank delivered to room. Pt requesting transport via [facility]. LCSW confirmed with EMS that pt will be able to take portable tank on truck. RN aware and will call to set up transport when pt is ready. LCSW made referral to [facility] for PCP with pt's permission. Voicemail left and pt aware to contact [facility] if she has not heard from them in a few days. Information on AVS.       Final next level of care: Home/Self Care Barriers to Discharge: Barriers Resolved   Patient Goals and CMS Choice        Discharge Placement                Patient to be transferred to facility by: [facility] Name of family member notified: pt only Patient and family notified of of transfer: 02/02/20  Discharge Plan and Services                DME Arranged: Oxygen DME Agency: [facility]                  Social Determinants of Health (SDOH) Interventions     Readmission Risk Interventions No flowsheet data found.

## 2020-02-02 NOTE — Discharge Summary (Signed)
Physician Discharge Summary  [name] EAV:[id number] DOB: Jun 11, 1960 DOA: 01/28/2020  PCP: Patient, No Pcp Per  Admit date: 01/28/2020 Discharge date: 02/02/2020  Admitted From: Home Disposition:  Home   Recommendations for Outpatient Follow-up:  1. Follow up with PCP in 1-2 weeks 2. Please obtain BMP/CBC in one week   Home Health: HHPT Equipment/Devices: 2L [place]  Discharge Condition: Stable CODE STATUS: FULL Diet recommendation: Heart Healthy / Carb Modified /    Brief/Interim Summary: 59 y.o.femalewith medical history significant ofmorbid obesity, ongoing tobacco abuse, poor mobility and essentially bedbound over the last year, chronic lower extremity swelling, does not see doctor regularly so no defined medical problems, on no medications at home comes to the hospital with complaints of shortness of breath. Patient lives with her fianc and he had a URI couple weeks ago. Ever since, patient also felt URI type symptoms, initially started as a mild sore throat but then progressed to muscle aches, generalized fatigue and eventually shortness of breath over the last couple of days. She denies any loss of taste or smell. She reports chills occasionally. No abdominal pain, but she did have nausea and vomiting. No diarrhea. She has been complaining of a cough but this is somewhat chronic for her.  ED Course:In the ED she is afebrile 98.6, tachypneic with respiratory rate 23, normotensive. She was satting in the mid 80s on room air requiring 3 L supplemental oxygen. Blood work shows normal CMP with a glucose of 114, BNP is normal, high-sensitivity troponin normal, CRP mildly elevated 2.3, negative procalcitonin and lactic acid of 1.2. CBC is unremarkable. Chest x-ray with small pleural effusion left base atelectasis. She tested positive for COVID-19, and due to hypoxia [facility] asked to admit  Discharge Diagnoses:  Acute hypoxic respiratory failure due to COVID-19 viral  pneumonia -finished remdesivir 02/01/20 -CRP 2.3>>0.7>>0.6 -Ddimer 0.79>>0.67>>0.54 -restart IV steroids>>d/c home with prednisone x 5 more days -Incentive spirometry, flutter valve, proning as able -vitamin C and zinc -stable on 2L -ambulatory pulse showed desaturation <88% -2L [place] set up for d/c  COPD Exacerbation -started combivent -restart IV steroids>>d/c home with prednisone x 5 more days  Immobility, deconditioning -Get PT consult>>>SNF -due to insurance barriers, SNF placement not possible -d/c home with HHPT  Morbid obesity -Patient would benefit from weight loss  Tobacco abuse -Provide nicotine patch -tobacco cessation discussed  Chronic venous stasis, chronic lower extremity edema -venous duplex--neg -12/3 Echo--EF 55-60%, no WMA, small pericardial effusion, trivial MR  Impaired Glucose Tolerance -A1C--6.2   Discharge Instructions   Allergies as of 02/02/2020   No Known Allergies     Medication List    TAKE these medications   oxyCODONE-acetaminophen 5-325 MG tablet Commonly known as: PERCOCET/ROXICET Take 1 tablet by mouth every 4 (four) hours as needed for severe pain.   predniSONE 50 MG tablet Commonly known as: DELTASONE Take 1 tablet (50 mg total) by mouth 2 (two) times daily with a meal.            Durable Medical Equipment  (From admission, onward)         Start     Ordered   02/02/20 1323  For home use only DME oxygen  Once       Question Answer Comment  Length of Need 6 Months   Mode or (Route) Nasal cannula   Liters per Minute 2   Frequency Continuous (stationary and portable oxygen unit needed)   Oxygen conserving device Yes   Oxygen delivery system Gas  02/02/20 1322          Follow-up Information    [facility] Follow up.   Why: Should call you to schedule appointment about arranging PCP. Please call if you have not heard from in the next few days.  Contact information: [phone]       [facility],  [facility] Follow up.   Why: Oxygen  Contact information: [address] [address] [phone]              No Known Allergies  Consultations:  none   Procedures/Studies: US Venous Img Lower Bilateral (DVT)  Result Date: 01/30/2020 CLINICAL DATA:  Bilateral pain and swelling EXAM: BILATERAL LOWER EXTREMITY VENOUS DOPPLER ULTRASOUND TECHNIQUE: Gray-scale sonography with compression, as well as color and duplex ultrasound, were performed to evaluate the deep venous system(s) from the level of the common femoral vein through the popliteal and proximal calf veins. COMPARISON:  None. FINDINGS: VENOUS Normal compressibility of the common femoral, superficial femoral, and popliteal veins, as well as the visualized calf veins. Visualized portions of profunda femoral vein and great saphenous vein unremarkable. No filling defects to suggest DVT on grayscale or color Doppler imaging. Doppler waveforms show normal direction of venous flow, normal respiratory plasticity and response to augmentation. OTHER None. Limitations: none IMPRESSION: Negative. Electronically Signed   By: Katherine Mantle M.D.   On: 01/30/2020 18:39   DG Chest Portable 1 View  Result Date: 01/28/2020 CLINICAL DATA:  Shortness of breath EXAM: PORTABLE CHEST 1 VIEW COMPARISON:  None. FINDINGS: There is a small left pleural effusion. There is atelectatic change in the left base. There is no appreciable edema or consolidation. Relative increased opacity in the right base region is felt to be due to overlying breast tissue. Heart is upper normal in size with pulmonary vascularity normal. No adenopathy. No bone lesions. IMPRESSION: Small left pleural effusion with left base atelectasis. No edema or airspace consolidation. Heart upper normal in size. Electronically Signed   By: Bretta Bang III M.D.   On: 01/28/2020 13:06   ECHOCARDIOGRAM COMPLETE  Result Date: 01/30/2020     ECHOCARDIOGRAM REPORT   Patient Name:   Margaret Weber Date of Exam: 01/30/2020 Medical Rec #:  [id number]       Height:       64.0 in Accession #:    [id number]      Weight:       250.0 lb Date of Birth:  1960-04-06       BSA:          2.151 m Patient Age:    59 years        BP:           117/74 mmHg Patient Gender: F               HR:           82 bpm. [facility] Procedure: 2D Echo Indications:    Bilateral leg edema [[id number]]  History:        Patient has no prior history of Echocardiogram examinations.                 Risk Factors:Current Smoker. Covid 19, Self neglect, Acute                 hypoxemic respiratory failure due to COVID-19 , Pleural                 effusion.  Sonographer:    Jeryl Columbia RDCS (AE) Referring Phys: [id number] CLANFORD L JOHNSON IMPRESSIONS  1. Left ventricular ejection fraction, by estimation, is 55 to 60%. The left ventricle has normal function. The left ventricle has no regional wall motion abnormalities. There is mild left ventricular hypertrophy. Left ventricular diastolic parameters were normal.  2. Right ventricular systolic function is normal. The right ventricular size is normal. Tricuspid regurgitation signal is inadequate for assessing PA pressure.  3. A small pericardial effusion is present. The pericardial effusion is anterior to the right ventricle.  4. The mitral valve is grossly normal. Trivial mitral valve regurgitation.  5. The aortic valve is tricuspid. There is mild calcification of the aortic valve. Aortic valve regurgitation is not visualized.  6. The inferior vena cava is dilated in size with <50% respiratory variability, suggesting right atrial pressure of 15 mmHg. FINDINGS  Left Ventricle: Left ventricular ejection fraction, by estimation, is 55 to 60%. The left ventricle has normal function. The left ventricle has no regional wall motion abnormalities. The left ventricular internal cavity size was normal in size. There is  mild left ventricular  hypertrophy. Left ventricular diastolic parameters were normal. Right Ventricle: The right ventricular size is normal. No increase in right ventricular wall thickness. Right ventricular systolic function is normal. Tricuspid regurgitation signal is inadequate for assessing PA pressure. Left Atrium: Left atrial size was normal in size. Right Atrium: Right atrial size was normal in size. Pericardium: A small pericardial effusion is present. The pericardial effusion is anterior to the right ventricle. Presence of pericardial fat pad. Mitral Valve: The mitral valve is grossly normal. Trivial mitral valve regurgitation. Tricuspid Valve: The tricuspid valve is grossly normal. Tricuspid valve regurgitation is trivial. Aortic Valve: The aortic valve is tricuspid. There is mild calcification of the aortic valve. Aortic valve regurgitation is not visualized. Pulmonic Valve: The pulmonic valve was grossly normal. Pulmonic valve regurgitation is trivial. Aorta: The aortic root is normal in size and structure. Venous: The inferior vena cava is dilated in size with less than 50% respiratory variability, suggesting right atrial pressure of 15 mmHg. IAS/Shunts: No atrial level shunt detected by color flow Doppler.  LEFT VENTRICLE PLAX 2D LVIDd:         4.55 cm  Diastology LVIDs:         3.24 cm  LV e' medial:    7.18 cm/s LV PW:         1.29 cm  LV E/e' medial:  7.7 LV IVS:        1.09 cm  LV e' lateral:   9.25 cm/s LVOT diam:     1.90 cm  LV E/e' lateral: 6.0 LVOT Area:     2.84 cm  RIGHT VENTRICLE RV S prime:     12.30 cm/s TAPSE (M-mode): 1.8 cm LEFT ATRIUM             Index       RIGHT ATRIUM           Index LA diam:        3.50 cm 1.63 cm/m  RA Area:     13.80 cm LA Vol (A2C):   30.1 ml 13.99 ml/m RA Volume:   35.20 ml  16.36 ml/m LA Vol (A4C):   18.5 ml 8.60 ml/m LA Biplane Vol: 24.3 ml 11.30 ml/m   AORTA Ao Root diam: 2.70 cm MITRAL VALVE MV Area (PHT): 2.80 cm    SHUNTS MV Decel Time: 271 msec  Systemic Diam: 1.90  cm MV E velocity: 55.30 cm/s MV A velocity: 62.60 cm/s MV E/A ratio:  0.88 Nona Dell MD Electronically signed by Nona Dell MD Signature Date/Time: 01/30/2020/11:27:01 AM    Final          Discharge Exam: Vitals:   02/02/20 1530 02/02/20 1541  BP:  122/78  Pulse:  93  Resp:  18  Temp:    SpO2: 90% 92%   Vitals:   02/02/20 0834 02/02/20 0926 02/02/20 1530 02/02/20 1541  BP:    122/78  Pulse: 86   93  Resp: 18   18  Temp:      TempSrc:      SpO2: 93% 90% 90% 92%  Weight:      Height:        General: Pt is alert, awake, not in acute distress Cardiovascular: RRR, S1/S2 +, no rubs, no gallops Respiratory: bibasilar rales. No wheeze Abdominal: Soft, NT, ND, bowel sounds + Extremities: no edema, no cyanosis   The results of significant diagnostics from this hospitalization (including imaging, microbiology, ancillary and laboratory) are listed below for reference.    Significant Diagnostic Studies: US Venous Img Lower Bilateral (DVT)  Result Date: 01/30/2020 CLINICAL DATA:  Bilateral pain and swelling EXAM: BILATERAL LOWER EXTREMITY VENOUS DOPPLER ULTRASOUND TECHNIQUE: Gray-scale sonography with compression, as well as color and duplex ultrasound, were performed to evaluate the deep venous system(s) from the level of the common femoral vein through the popliteal and proximal calf veins. COMPARISON:  None. FINDINGS: VENOUS Normal compressibility of the common femoral, superficial femoral, and popliteal veins, as well as the visualized calf veins. Visualized portions of profunda femoral vein and great saphenous vein unremarkable. No filling defects to suggest DVT on grayscale or color Doppler imaging. Doppler waveforms show normal direction of venous flow, normal respiratory plasticity and response to augmentation. OTHER None. Limitations: none IMPRESSION: Negative. Electronically Signed   By: Katherine Mantle M.D.   On: 01/30/2020 18:39   DG Chest Portable 1  View  Result Date: 01/28/2020 CLINICAL DATA:  Shortness of breath EXAM: PORTABLE CHEST 1 VIEW COMPARISON:  None. FINDINGS: There is a small left pleural effusion. There is atelectatic change in the left base. There is no appreciable edema or consolidation. Relative increased opacity in the right base region is felt to be due to overlying breast tissue. Heart is upper normal in size with pulmonary vascularity normal. No adenopathy. No bone lesions. IMPRESSION: Small left pleural effusion with left base atelectasis. No edema or airspace consolidation. Heart upper normal in size. Electronically Signed   By: Bretta Bang III M.D.   On: 01/28/2020 13:06   ECHOCARDIOGRAM COMPLETE  Result Date: 01/30/2020    ECHOCARDIOGRAM REPORT   Patient Name:   Margaret Weber Date of Exam: 01/30/2020 Medical Rec #:  [id number]       Height:       64.0 in Accession #:    [id number]      Weight:       250.0 lb Date of Birth:  [birth year]       BSA:          2.151 m Patient Age:    59 years        BP:           117/74 mmHg Patient Gender: F               HR:           82 bpm.  [facility] Procedure: 2D Echo Indications:    Bilateral leg edema [[id number]][[id number]]  History:        Patient has no prior history of Echocardiogram examinations.                 Risk Factors:Current Smoker. Covid 19, Self neglect, Acute                 hypoxemic respiratory failure due to COVID-19 , Pleural                 effusion.  Sonographer:    Jeryl ColumbiaJohanna Elliott RDCS (AE) Referring Phys: [id number] CLANFORD L JOHNSON IMPRESSIONS  1. Left ventricular ejection fraction, by estimation, is 55 to 60%. The left ventricle has normal function. The left ventricle has no regional wall motion abnormalities. There is mild left ventricular hypertrophy. Left ventricular diastolic parameters were normal.  2. Right ventricular systolic function is normal. The right ventricular size is normal. Tricuspid regurgitation signal is inadequate for assessing PA pressure.  3. A  small pericardial effusion is present. The pericardial effusion is anterior to the right ventricle.  4. The mitral valve is grossly normal. Trivial mitral valve regurgitation.  5. The aortic valve is tricuspid. There is mild calcification of the aortic valve. Aortic valve regurgitation is not visualized.  6. The inferior vena cava is dilated in size with <50% respiratory variability, suggesting right atrial pressure of 15 mmHg. FINDINGS  Left Ventricle: Left ventricular ejection fraction, by estimation, is 55 to 60%. The left ventricle has normal function. The left ventricle has no regional wall motion abnormalities. The left ventricular internal cavity size was normal in size. There is  mild left ventricular hypertrophy. Left ventricular diastolic parameters were normal. Right Ventricle: The right ventricular size is normal. No increase in right ventricular wall thickness. Right ventricular systolic function is normal. Tricuspid regurgitation signal is inadequate for assessing PA pressure. Left Atrium: Left atrial size was normal in size. Right Atrium: Right atrial size was normal in size. Pericardium: A small pericardial effusion is present. The pericardial effusion is anterior to the right ventricle. Presence of pericardial fat pad. Mitral Valve: The mitral valve is grossly normal. Trivial mitral valve regurgitation. Tricuspid Valve: The tricuspid valve is grossly normal. Tricuspid valve regurgitation is trivial. Aortic Valve: The aortic valve is tricuspid. There is mild calcification of the aortic valve. Aortic valve regurgitation is not visualized. Pulmonic Valve: The pulmonic valve was grossly normal. Pulmonic valve regurgitation is trivial. Aorta: The aortic root is normal in size and structure. Venous: The inferior vena cava is dilated in size with less than 50% respiratory variability, suggesting right atrial pressure of 15 mmHg. IAS/Shunts: No atrial level shunt detected by color flow Doppler.  LEFT  VENTRICLE PLAX 2D LVIDd:         4.55 cm  Diastology LVIDs:         3.24 cm  LV e' medial:    7.18 cm/s LV PW:         1.29 cm  LV E/e' medial:  7.7 LV IVS:        1.09 cm  LV e' lateral:   9.25 cm/s LVOT diam:     1.90 cm  LV E/e' lateral: 6.0 LVOT Area:     2.84 cm  RIGHT VENTRICLE RV S prime:     12.30 cm/s TAPSE (M-mode): 1.8 cm LEFT ATRIUM             Index  RIGHT ATRIUM           Index LA diam:        3.50 cm 1.63 cm/m  RA Area:     13.80 cm LA Vol (A2C):   30.1 ml 13.99 ml/m RA Volume:   35.20 ml  16.36 ml/m LA Vol (A4C):   18.5 ml 8.60 ml/m LA Biplane Vol: 24.3 ml 11.30 ml/m   AORTA Ao Root diam: 2.70 cm MITRAL VALVE MV Area (PHT): 2.80 cm    SHUNTS MV Decel Time: 271 msec    Systemic Diam: 1.90 cm MV E velocity: 55.30 cm/s MV A velocity: 62.60 cm/s MV E/A ratio:  0.88 Nona Dell MD Electronically signed by Nona Dell MD Signature Date/Time: 01/30/2020/11:27:01 AM    Final      Microbiology: Recent Results (from the past 240 hour(s))  Resp Panel by RT-PCR (Flu A&B, Covid) Nasopharyngeal Swab     Status: Abnormal   Collection Time: 01/28/20 12:41 PM   Specimen: Nasopharyngeal Swab; Nasopharyngeal(NP) swabs in vial transport medium  Result Value Ref Range Status   SARS Coronavirus 2 by RT PCR POSITIVE (A) NEGATIVE Final    Comment: RESULT CALLED TO, READ BACK BY AND VERIFIED WITH: BRAME M AT 1426 BY THOMPSON S.ON [id number] (NOTE) SARS-CoV-2 target nucleic acids are DETECTED.  The SARS-CoV-2 RNA is generally detectable in upper respiratory specimens during the acute phase of infection. Positive results are indicative of the presence of the identified virus, but do not rule out bacterial infection or co-infection with other pathogens not detected by the test. Clinical correlation with patient history and other diagnostic information is necessary to determine patient infection status. The expected result is Negative.  Fact Sheet for  Patients: BloggerCourse.com  Fact Sheet for Healthcare Providers: SeriousBroker.it  This test is not yet approved or cleared by the [place] FDA and  has been authorized for detection and/or diagnosis of SARS-CoV-2 by FDA under an Emergency Use Authorization (EUA).  This EUA will remain in effect (meaning this test  can be used) for the duration of  the COVID-19 declaration under Section 564(b)(1) of the Act, 21 U.S.C. section 360bbb-3(b)(1), unless the authorization is terminated or revoked sooner.     Influenza A by PCR NEGATIVE NEGATIVE Final   Influenza B by PCR NEGATIVE NEGATIVE Final    Comment: (NOTE) The Xpert Xpress SARS-CoV-2/FLU/RSV plus assay is intended as an aid in the diagnosis of influenza from Nasopharyngeal swab specimens and should not be used as a sole basis for treatment. Nasal washings and aspirates are unacceptable for Xpert Xpress SARS-CoV-2/FLU/RSV testing.  Fact Sheet for Patients: BloggerCourse.com  Fact Sheet for Healthcare Providers: SeriousBroker.it  This test is not yet approved or cleared by the [place] FDA and has been authorized for detection and/or diagnosis of SARS-CoV-2 by FDA under an Emergency Use Authorization (EUA). This EUA will remain in effect (meaning this test can be used) for the duration of the COVID-19 declaration under Section 564(b)(1) of the Act, 21 U.S.C. section 360bbb-3(b)(1), unless the authorization is terminated or revoked.  Performed at [facility], [address] [place], [place] [address]   Blood Culture (routine x 2)     Status: None   Collection Time: 01/28/20  3:30 PM   Specimen: BLOOD RIGHT HAND  Result Value Ref Range Status   Specimen Description BLOOD RIGHT HAND  Final   Special Requests   Final    BOTTLES DRAWN AEROBIC AND ANAEROBIC Blood Culture results may not  be optimal due to an inadequate  volume of blood received in culture bottles   Culture   Final    NO GROWTH 5 DAYS Performed at [facility], [address], [place], [place] [address]    Report Status 02/02/2020 FINAL  Final  Blood Culture (routine x 2)     Status: None   Collection Time: 01/28/20  3:30 PM   Specimen: Left Antecubital; Blood  Result Value Ref Range Status   Specimen Description LEFT ANTECUBITAL  Final   Special Requests   Final    BOTTLES DRAWN AEROBIC AND ANAEROBIC Blood Culture results may not be optimal due to an inadequate volume of blood received in culture bottles   Culture   Final    NO GROWTH 5 DAYS Performed at [facility], [address] [place], [place] [address]    Report Status 02/02/2020 FINAL  Final     Labs: Basic Metabolic Panel: Recent Labs  Lab 01/28/20 1434 01/28/20 1434 01/29/20 0652 01/29/20 0652 01/30/20 0956 01/31/20 0633  NA 136  --  137  --  138 138  K 3.9   < > 4.0   < > 3.9 4.2  CL 99  --  99  --  101 101  CO2 27  --  29  --  26 27  GLUCOSE 114*  --  151*  --  134* 171*  BUN 11  --  11  --  17 17  CREATININE 0.82  --  0.77  --  0.68 0.61  CALCIUM 9.0  --  8.5*  --  8.5* 8.7*   < > = values in this interval not displayed.   Liver Function Tests: Recent Labs  Lab 01/28/20 1434 01/29/20 0652 01/30/20 0956 01/31/20 0633  AST 62*  ALT 76*  ALKPHOS 71 71 64 67  BILITOT 0.4 0.4 0.6 0.4  PROT 8.0 8.2* 7.7 7.8  ALBUMIN 4.1 3.9 3.8 3.7   No results for input(s): LIPASE, AMYLASE in the last 168 hours. No results for input(s): AMMONIA in the last 168 hours. CBC: Recent Labs  Lab 01/28/20 1434 01/29/20 0652 01/30/20 0956 01/31/20 0633  WBC 6.3 5.2 9.1 9.3  NEUTROABS 4.5 4.2 7.1 7.6  HGB 14.1 14.3 14.1 14.8  HCT 44.7 45.4 44.4 46.0  MCV 99.3 100.9* 98.9 98.1  PLT 248 239 263 274   Cardiac Enzymes: No results for input(s): CKTOTAL, CKMB, CKMBINDEX, TROPONINI in the last 168 hours. BNP: Invalid input(s): POCBNP CBG: Recent Labs   Lab 02/01/20 1306 02/01/20 1726 02/01/20 2032 02/02/20 0828 02/02/20 1222  GLUCAP 182* 218* 230* 223* 188*    Time coordinating discharge:  36 minutes  Signed:  Catarina Hartshorn, DO [facility] Pager: [phone] 02/02/2020, 4:03 PM

## 2020-07-28 DIAGNOSIS — Z419 Encounter for procedure for purposes other than remedying health state, unspecified: Secondary | ICD-10-CM | POA: Diagnosis not present

## 2020-08-27 DIAGNOSIS — Z419 Encounter for procedure for purposes other than remedying health state, unspecified: Secondary | ICD-10-CM | POA: Diagnosis not present

## 2020-09-27 DIAGNOSIS — Z419 Encounter for procedure for purposes other than remedying health state, unspecified: Secondary | ICD-10-CM | POA: Diagnosis not present

## 2020-10-28 DIAGNOSIS — Z419 Encounter for procedure for purposes other than remedying health state, unspecified: Secondary | ICD-10-CM | POA: Diagnosis not present

## 2020-11-27 DIAGNOSIS — Z419 Encounter for procedure for purposes other than remedying health state, unspecified: Secondary | ICD-10-CM | POA: Diagnosis not present

## 2020-12-02 DIAGNOSIS — U071 COVID-19: Secondary | ICD-10-CM | POA: Diagnosis not present

## 2020-12-02 DIAGNOSIS — J9601 Acute respiratory failure with hypoxia: Secondary | ICD-10-CM | POA: Diagnosis not present

## 2020-12-28 DIAGNOSIS — Z419 Encounter for procedure for purposes other than remedying health state, unspecified: Secondary | ICD-10-CM | POA: Diagnosis not present

## 2021-01-27 DIAGNOSIS — Z419 Encounter for procedure for purposes other than remedying health state, unspecified: Secondary | ICD-10-CM | POA: Diagnosis not present

## 2021-02-27 DIAGNOSIS — Z419 Encounter for procedure for purposes other than remedying health state, unspecified: Secondary | ICD-10-CM | POA: Diagnosis not present

## 2021-03-30 DIAGNOSIS — Z419 Encounter for procedure for purposes other than remedying health state, unspecified: Secondary | ICD-10-CM | POA: Diagnosis not present

## 2021-05-28 DIAGNOSIS — Z419 Encounter for procedure for purposes other than remedying health state, unspecified: Secondary | ICD-10-CM | POA: Diagnosis not present

## 2021-06-27 DIAGNOSIS — Z419 Encounter for procedure for purposes other than remedying health state, unspecified: Secondary | ICD-10-CM | POA: Diagnosis not present

## 2021-07-14 ENCOUNTER — Inpatient Hospital Stay (HOSPITAL_COMMUNITY)
Admission: EM | Admit: 2021-07-14 | Discharge: 2021-07-22 | DRG: 602 | Disposition: A | Discharge status: Skilled Nursing Facility | Payer: Medicaid Other | Attending: Family Medicine | Admitting: Family Medicine

## 2021-07-14 ENCOUNTER — Encounter (HOSPITAL_COMMUNITY): Payer: Self-pay | Admitting: Radiology

## 2021-07-14 ENCOUNTER — Emergency Department (HOSPITAL_COMMUNITY): Payer: Medicaid Other

## 2021-07-14 DIAGNOSIS — R600 Localized edema: Secondary | ICD-10-CM | POA: Diagnosis not present

## 2021-07-14 DIAGNOSIS — L03116 Cellulitis of left lower limb: Secondary | ICD-10-CM | POA: Diagnosis not present

## 2021-07-14 DIAGNOSIS — Z6841 Body Mass Index (BMI) 40.0 and over, adult: Secondary | ICD-10-CM | POA: Diagnosis not present

## 2021-07-14 DIAGNOSIS — L03119 Cellulitis of unspecified part of limb: Secondary | ICD-10-CM

## 2021-07-14 DIAGNOSIS — L0390 Cellulitis, unspecified: Secondary | ICD-10-CM | POA: Diagnosis present

## 2021-07-14 DIAGNOSIS — I5033 Acute on chronic diastolic (congestive) heart failure: Secondary | ICD-10-CM | POA: Diagnosis not present

## 2021-07-14 DIAGNOSIS — R262 Difficulty in walking, not elsewhere classified: Secondary | ICD-10-CM

## 2021-07-14 DIAGNOSIS — E11628 Type 2 diabetes mellitus with other skin complications: Secondary | ICD-10-CM | POA: Diagnosis present

## 2021-07-14 DIAGNOSIS — R609 Edema, unspecified: Principal | ICD-10-CM

## 2021-07-14 DIAGNOSIS — L850 Acquired ichthyosis: Secondary | ICD-10-CM | POA: Diagnosis present

## 2021-07-14 DIAGNOSIS — I890 Lymphedema, not elsewhere classified: Secondary | ICD-10-CM

## 2021-07-14 DIAGNOSIS — Q809 Congenital ichthyosis, unspecified: Secondary | ICD-10-CM

## 2021-07-14 DIAGNOSIS — K5900 Constipation, unspecified: Secondary | ICD-10-CM | POA: Diagnosis not present

## 2021-07-14 DIAGNOSIS — Z7952 Long term (current) use of systemic steroids: Secondary | ICD-10-CM | POA: Diagnosis not present

## 2021-07-14 DIAGNOSIS — Z8249 Family history of ischemic heart disease and other diseases of the circulatory system: Secondary | ICD-10-CM | POA: Diagnosis not present

## 2021-07-14 DIAGNOSIS — J449 Chronic obstructive pulmonary disease, unspecified: Secondary | ICD-10-CM | POA: Diagnosis not present

## 2021-07-14 DIAGNOSIS — R069 Unspecified abnormalities of breathing: Secondary | ICD-10-CM | POA: Diagnosis not present

## 2021-07-14 DIAGNOSIS — R7303 Prediabetes: Secondary | ICD-10-CM

## 2021-07-14 DIAGNOSIS — Z8616 Personal history of COVID-19: Secondary | ICD-10-CM

## 2021-07-14 DIAGNOSIS — M7989 Other specified soft tissue disorders: Secondary | ICD-10-CM | POA: Diagnosis not present

## 2021-07-14 DIAGNOSIS — Z0389 Encounter for observation for other suspected diseases and conditions ruled out: Secondary | ICD-10-CM | POA: Diagnosis not present

## 2021-07-14 DIAGNOSIS — R000 Tachycardia, unspecified: Secondary | ICD-10-CM | POA: Diagnosis not present

## 2021-07-14 DIAGNOSIS — F17210 Nicotine dependence, cigarettes, uncomplicated: Secondary | ICD-10-CM | POA: Diagnosis not present

## 2021-07-14 DIAGNOSIS — I10 Essential (primary) hypertension: Secondary | ICD-10-CM | POA: Diagnosis not present

## 2021-07-14 DIAGNOSIS — L03115 Cellulitis of right lower limb: Principal | ICD-10-CM | POA: Diagnosis present

## 2021-07-14 DIAGNOSIS — R5381 Other malaise: Secondary | ICD-10-CM | POA: Diagnosis present

## 2021-07-14 DIAGNOSIS — Z743 Need for continuous supervision: Secondary | ICD-10-CM | POA: Diagnosis not present

## 2021-07-14 DIAGNOSIS — Z720 Tobacco use: Secondary | ICD-10-CM | POA: Diagnosis present

## 2021-07-14 DIAGNOSIS — R4689 Other symptoms and signs involving appearance and behavior: Secondary | ICD-10-CM | POA: Diagnosis present

## 2021-07-14 DIAGNOSIS — Z7689 Persons encountering health services in other specified circumstances: Secondary | ICD-10-CM | POA: Diagnosis not present

## 2021-07-14 DIAGNOSIS — M61462 Other calcification of muscle, left lower leg: Secondary | ICD-10-CM | POA: Diagnosis not present

## 2021-07-14 HISTORY — DX: Chronic obstructive pulmonary disease, unspecified: J44.9

## 2021-07-14 HISTORY — DX: Lymphedema, not elsewhere classified: I89.0

## 2021-07-14 LAB — COMPREHENSIVE METABOLIC PANEL
ALT: 30 U/L (ref 0–44)
AST: 21 U/L (ref 15–41)
Albumin: 4.1 g/dL (ref 3.5–5.0)
Alkaline Phosphatase: 87 U/L (ref 38–126)
Anion gap: 9 (ref 5–15)
BUN: 11 mg/dL (ref 8–23)
CO2: 28 mmol/L (ref 22–32)
Calcium: 9.2 mg/dL (ref 8.9–10.3)
Chloride: 103 mmol/L (ref 98–111)
Creatinine, Ser: 0.64 mg/dL (ref 0.44–1.00)
GFR, Estimated: >60 mL/min (ref >60)
Glucose, Bld: 120 mg/dL — ABNORMAL HIGH (ref 70–99)
Potassium: 4.3 mmol/L (ref 3.5–5.1)
Sodium: 140 mmol/L (ref 135–145)
Total Bilirubin: 0.3 mg/dL (ref 0.3–1.2)
Total Protein: 8.2 g/dL — ABNORMAL HIGH (ref 6.5–8.1)

## 2021-07-14 LAB — PROTIME-INR
INR: 1 (ref 0.8–1.2)
Prothrombin Time: 12.9 seconds (ref 11.4–15.2)

## 2021-07-14 LAB — CBC WITH DIFFERENTIAL/PLATELET
Abs Immature Granulocytes: 0.07 10*3/uL (ref 0.00–0.07)
Basophils Absolute: 0.1 10*3/uL (ref 0.0–0.1)
Basophils Relative: 0 %
Eosinophils Absolute: 0.1 10*3/uL (ref 0.0–0.5)
Eosinophils Relative: 0 %
HCT: 46 % (ref 36.0–46.0)
Hemoglobin: 14 g/dL (ref 12.0–15.0)
Immature Granulocytes: 1 %
Lymphocytes Relative: 10 %
Lymphs Abs: 1.5 10*3/uL (ref 0.7–4.0)
MCH: 30 pg (ref 26.0–34.0)
MCHC: 30.4 g/dL (ref 30.0–36.0)
MCV: 98.7 fL (ref 80.0–100.0)
Monocytes Absolute: 0.9 10*3/uL (ref 0.1–1.0)
Monocytes Relative: 6 %
Neutro Abs: 11.6 10*3/uL — ABNORMAL HIGH (ref 1.7–7.7)
Neutrophils Relative %: 83 %
Platelets: 265 10*3/uL (ref 150–400)
RBC: 4.66 MIL/uL (ref 3.87–5.11)
RDW: 14.2 % (ref 11.5–15.5)
WBC: 14.1 10*3/uL — ABNORMAL HIGH (ref 4.0–10.5)
nRBC: 0 % (ref 0.0–0.2)

## 2021-07-14 LAB — URINALYSIS, ROUTINE W REFLEX MICROSCOPIC
Bilirubin Urine: NEGATIVE
Glucose, UA: NEGATIVE mg/dL
Ketones, ur: NEGATIVE mg/dL
Leukocytes,Ua: NEGATIVE
Nitrite: NEGATIVE
Protein, ur: NEGATIVE mg/dL
Specific Gravity, Urine: 1.004 — ABNORMAL LOW (ref 1.005–1.030)
pH: 7 (ref 5.0–8.0)

## 2021-07-14 LAB — C-REACTIVE PROTEIN: CRP: 1.3 mg/dL — ABNORMAL HIGH (ref <1.0)

## 2021-07-14 LAB — APTT: aPTT: 24 seconds (ref 24–36)

## 2021-07-14 LAB — SEDIMENTATION RATE: Sed Rate: 30 mm/hr — ABNORMAL HIGH (ref 0–22)

## 2021-07-14 LAB — BRAIN NATRIURETIC PEPTIDE: B Natriuretic Peptide: 62 pg/mL (ref 0.0–100.0)

## 2021-07-14 LAB — LACTIC ACID, PLASMA: Lactic Acid, Venous: 1.5 mmol/L (ref 0.5–1.9)

## 2021-07-14 MED ORDER — NICOTINE 14 MG/24HR TD PT24
14.0000 mg | MEDICATED_PATCH | Freq: Once | TRANSDERMAL | Status: DC
  Administered 2021-07-14: 14 mg via TRANSDERMAL
  Filled 2021-07-14: qty 1

## 2021-07-14 MED ORDER — ONDANSETRON HCL 4 MG/2ML IJ SOLN: 4.0000 mg | Freq: Four times a day (QID) | INTRAMUSCULAR | Status: DC | PRN

## 2021-07-14 MED ORDER — IPRATROPIUM-ALBUTEROL 0.5-2.5 (3) MG/3ML IN SOLN
3.0000 mL | Freq: Four times a day (QID) | RESPIRATORY_TRACT | Status: DC
  Administered 2021-07-14: 3 mL via RESPIRATORY_TRACT
  Filled 2021-07-14: qty 3

## 2021-07-14 MED ORDER — ENOXAPARIN SODIUM 60 MG/0.6ML IJ SOSY
50.0000 mg | PREFILLED_SYRINGE | INTRAMUSCULAR | Status: DC
  Administered 2021-07-14 – 2021-07-21 (×7): 50 mg via SUBCUTANEOUS
  Filled 2021-07-14 (×9): qty 0.6

## 2021-07-14 MED ORDER — IPRATROPIUM-ALBUTEROL 0.5-2.5 (3) MG/3ML IN SOLN: 3.0000 mL | RESPIRATORY_TRACT | Status: DC | PRN

## 2021-07-14 MED ORDER — VANCOMYCIN HCL 2000 MG/400ML IV SOLN
2000.0000 mg | Freq: Once | INTRAVENOUS | Status: AC
  Administered 2021-07-14: 2000 mg via INTRAVENOUS
  Filled 2021-07-14: qty 400

## 2021-07-14 MED ORDER — FUROSEMIDE 10 MG/ML IJ SOLN
40.0000 mg | Freq: Once | INTRAMUSCULAR | Status: AC
  Administered 2021-07-14: 40 mg via INTRAVENOUS
  Filled 2021-07-14: qty 4

## 2021-07-14 MED ORDER — ACETAMINOPHEN 325 MG PO TABS: 650.0000 mg | ORAL_TABLET | Freq: Four times a day (QID) | ORAL | Status: DC | PRN

## 2021-07-14 MED ORDER — VANCOMYCIN HCL 1500 MG/300ML IV SOLN
1500.0000 mg | INTRAVENOUS | Status: DC
  Administered 2021-07-15 – 2021-07-16 (×2): 1500 mg via INTRAVENOUS
  Filled 2021-07-14 (×2): qty 300

## 2021-07-14 MED ORDER — ONDANSETRON HCL 4 MG PO TABS: 4.0000 mg | ORAL_TABLET | Freq: Four times a day (QID) | ORAL | Status: DC | PRN

## 2021-07-14 MED ORDER — POLYETHYLENE GLYCOL 3350 17 G PO PACK: 17.0000 g | PACK | Freq: Every day | ORAL | Status: DC | PRN

## 2021-07-14 MED ORDER — IPRATROPIUM-ALBUTEROL 0.5-2.5 (3) MG/3ML IN SOLN
3.0000 mL | Freq: Two times a day (BID) | RESPIRATORY_TRACT | Status: DC
  Administered 2021-07-15 – 2021-07-18 (×7): 3 mL via RESPIRATORY_TRACT
  Filled 2021-07-14 (×7): qty 3

## 2021-07-14 MED ORDER — ACETAMINOPHEN 650 MG RE SUPP: 650.0000 mg | Freq: Four times a day (QID) | RECTAL | Status: DC | PRN

## 2021-07-14 NOTE — Assessment & Plan Note (Addendum)
--  1+ PPD --Nicotine patch --recommend cessation

## 2021-07-14 NOTE — Assessment & Plan Note (Addendum)
--  new diagnosis. HgbA1c 6.5 --Does not have a PCP and ahs seen a doctor in a long time --Patient stated that she will not take insulin at home, will not check blood sugars at home.  --continue SSI, oral Rx as outpatient

## 2021-07-14 NOTE — ED Provider Notes (Signed)
[facility] EMERGENCY DEPARTMENT Provider Note  CSN: [id number] Arrival date & time: 07/14/21 1317  Chief Complaint(s) Leg Swelling  HPI Margaret Weber is a 61 y.o. female with PMH COPD, ichthyosis vulgaris, self-neglect, bilateral lower extremity edema with stasis dermatitis who presents emergency department for evaluation of bilateral lower extremity swelling and neglect.  Patient's social worker has opened and Adult Protective Services case on the patient and due to suspected pressure ulcers in the feet, she was brought to the emergency department for evaluation.  Patient arrives tachycardic with complaints of worsening lower extremity edema over the last 6 months but denies shortness of breath, headache, fever or other systemic symptoms.  There is concerned the patient may need to be placed in a SNF.   Past Medical History History reviewed. No pertinent past medical history. Patient Active Problem List   Diagnosis Date Noted   COPD with acute exacerbation (HCC) 02/01/2020   Prediabetes 01/31/2020   small pericardial effusion 01/31/2020   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   Tobacco abuse 01/29/2020   Self neglect 01/29/2020   COVID-19    Hypoxia    Pleural effusion    Acute hypoxemic respiratory failure due to COVID-19 [facility]) 01/28/2020   Home Medication(s) Prior to Admission medications   Medication Sig Start Date End Date Taking? Authorizing Provider  oxyCODONE-acetaminophen (PERCOCET/ROXICET) 5-325 MG tablet Take 1 tablet by mouth every 4 (four) hours as needed for severe pain. 08/04/16   Rancour, Jeannett Senior, MD  predniSONE (DELTASONE) 50 MG tablet Take 1 tablet (50 mg total) by mouth 2 (two) times daily with a meal. 02/02/20   Catarina Hartshorn, MD                                                                                                                                    Past Surgical History No past surgical history on file. Family History No family history on file.  Social  History Social History   Tobacco Use   Smoking status: Every Day    Packs/day: 1.50    Types: Cigarettes   Smokeless tobacco: Never  Substance Use Topics   Alcohol use: No   Drug use: No   Allergies Patient has no known allergies.  Review of Systems Review of Systems  Cardiovascular:  Positive for leg swelling.  Skin:  Positive for rash.   Physical Exam Vital Signs  I have reviewed the triage vital signs BP 130/71   Pulse (!) 111   Temp 98.1 F (36.7 C) (Oral)   Resp 15   SpO2 92%   Physical Exam Vitals and nursing note reviewed.  Constitutional:      General: She is not in acute distress.    Appearance: She is well-developed.  HENT:     Head: Normocephalic and atraumatic.  Eyes:     Conjunctiva/sclera: Conjunctivae normal.  Cardiovascular:     Rate and Rhythm: Normal rate and regular rhythm.  Heart sounds: No murmur heard. Pulmonary:     Effort: Pulmonary effort is normal. No respiratory distress.     Breath sounds: Normal breath sounds.  Abdominal:     Palpations: Abdomen is soft.     Tenderness: There is no abdominal tenderness.  Musculoskeletal:        General: No swelling.     Cervical back: Neck supple.     Right lower leg: Edema present.     Left lower leg: Edema present.  Skin:    General: Skin is warm and dry.     Capillary Refill: Capillary refill takes less than 2 seconds.  Neurological:     Mental Status: She is alert.  Psychiatric:        Mood and Affect: Mood normal.         ED Results and Treatments Labs (all labs ordered are listed, but only abnormal results are displayed) Labs Reviewed  COMPREHENSIVE METABOLIC PANEL - Abnormal; Notable for the following components:      Result Value   Glucose, Bld 120 (*)    Total Protein 8.2 (*)    All other components within normal limits  CBC WITH DIFFERENTIAL/PLATELET - Abnormal; Notable for the following components:   WBC 14.1 (*)    Neutro Abs 11.6 (*)    All other components  within normal limits  CULTURE, BLOOD (ROUTINE X 2)  CULTURE, BLOOD (ROUTINE X 2)  URINE CULTURE  LACTIC ACID, PLASMA  PROTIME-INR  APTT  LACTIC ACID, PLASMA  URINALYSIS, ROUTINE W REFLEX MICROSCOPIC  SEDIMENTATION RATE  C-REACTIVE PROTEIN                                                                                                                          Radiology DG Tibia/Fibula Left  Result Date: 07/14/2021 CLINICAL DATA:  Rule out osteo EXAM: LEFT TIBIA AND FIBULA - 2 VIEW COMPARISON:  None Available. FINDINGS: Negative for acute fracture. Soft tissue calcification lateral to the stuffy via likely due to chronic injury. No osteomyelitis. IMPRESSION: No acute skeletal abnormality. Electronically Signed   By: Marlan Palau M.D.   On: 07/14/2021 15:26   DG Tibia/Fibula Right  Result Date: 07/14/2021 CLINICAL DATA:  Rule out osteomyelitis EXAM: RIGHT TIBIA AND FIBULA - 2 VIEW COMPARISON:  None Available. FINDINGS: Negative for fracture or osteomyelitis. Diffuse soft tissue swelling. IMPRESSION: No acute skeletal abnormality.  Soft tissue swelling Electronically Signed   By: Marlan Palau M.D.   On: 07/14/2021 15:26   DG Chest Port 1 View  Result Date: 07/14/2021 CLINICAL DATA:  Question sepsis EXAM: PORTABLE CHEST 1 VIEW COMPARISON:  01/28/2020 FINDINGS: The heart size and mediastinal contours are within normal limits. Both lungs are clear. The visualized skeletal structures are unremarkable. IMPRESSION: No active disease. Electronically Signed   By: Marlan Palau M.D.   On: 07/14/2021 15:23   DG Foot Complete Left  Result Date: 07/14/2021 CLINICAL DATA:  Question sepsis.  Rule out osteo  EXAM: LEFT FOOT - COMPLETE 3+ VIEW COMPARISON:  None Available. FINDINGS: There is no evidence of fracture or dislocation. There is no evidence of arthropathy or other focal bone abnormality. Soft tissues are unremarkable. IMPRESSION: Negative. Electronically Signed   By: Marlan Palau M.D.   On:  07/14/2021 15:24   DG Foot Complete Right  Result Date: 07/14/2021 CLINICAL DATA:  Rule out osteo EXAM: RIGHT FOOT COMPLETE - 3+ VIEW COMPARISON:  None Available. FINDINGS: Negative for fracture.  No evidence of osteomyelitis Prominent soft tissue swelling on the dorsum of the foot overlying the metatarsals. No foreign body. IMPRESSION: Negative for osteomyelitis Prominent soft tissue swelling dorsal foot. Possible hematoma or infection. Electronically Signed   By: Marlan Palau M.D.   On: 07/14/2021 15:25    Pertinent labs & imaging results that were available during my care of the patient were reviewed by me and considered in my medical decision making (see MDM for details).  Medications Ordered in ED Medications  nicotine (NICODERM CQ - dosed in mg/24 hours) patch 14 mg (has no administration in time range)                                                                                                                                     Procedures .Critical Care Performed by: Glendora Score, MD Authorized by: Glendora Score, MD   Critical care provider statement:    Critical care time (minutes):  30   Critical care was necessary to treat or prevent imminent or life-threatening deterioration of the following conditions:  Sepsis   Critical care was time spent personally by me on the following activities:  Development of treatment plan with patient or surrogate, discussions with consultants, evaluation of patient's response to treatment, examination of patient, ordering and review of laboratory studies, ordering and review of radiographic studies, ordering and performing treatments and interventions, pulse oximetry, re-evaluation of patient's condition and review of old charts  (including critical care time)  Medical Decision Making / ED Course   This patient presents to the ED for concern of lower extremity edema, this involves an extensive number of treatment options, and is a  complaint that carries with it a high risk of complications and morbidity.  The differential diagnosis includes stasis dermatitis, neglect, cellulitis, fluid overload  MDM: Seen emergency room for evaluation of multiple complaints as described above.  Physical exam with significant bilateral lower extremity edema with a scaling rash developing over bilateral feet and associated erythema and tenderness to the lower extremities.  Laboratory evaluation with a leukocytosis to 14.1 with a neutrophilic predominance, lab work-up otherwise reassuringly negative.  Imaging of the chest and bilateral lower extremities showing prominent soft tissue swelling but no direct evidence of osteomyelitis.  I do have clinical concern for developing cellulitis in the lower extremities given her tachycardia, elevated white count and erythema and thus started vancomycin.  Patient will require admission for  diuresis, IV antibiotics and social work evaluation.   Additional history obtained:  -External records from outside source obtained and reviewed including: Chart review including previous notes, labs, imaging, consultation notes   Lab Tests: -I ordered, reviewed, and interpreted labs.   The pertinent results include:   Labs Reviewed  COMPREHENSIVE METABOLIC PANEL - Abnormal; Notable for the following components:      Result Value   Glucose, Bld 120 (*)    Total Protein 8.2 (*)    All other components within normal limits  CBC WITH DIFFERENTIAL/PLATELET - Abnormal; Notable for the following components:   WBC 14.1 (*)    Neutro Abs 11.6 (*)    All other components within normal limits  CULTURE, BLOOD (ROUTINE X 2)  CULTURE, BLOOD (ROUTINE X 2)  URINE CULTURE  LACTIC ACID, PLASMA  PROTIME-INR  APTT  LACTIC ACID, PLASMA  URINALYSIS, ROUTINE W REFLEX MICROSCOPIC  SEDIMENTATION RATE  C-REACTIVE PROTEIN      EKG   EKG Interpretation  Date/Time:  Thursday Jul 14 2021 14:13:54 EDT Ventricular Rate:   107 PR Interval:  168 QRS Duration: 89 QT Interval:  340 QTC Calculation: 454 R Axis:   97 Text Interpretation: Sinus tachycardia Right axis deviation Baseline wander in lead(s) I aVL V3 Confirmed by Ralf Konopka ([id number]) on 07/14/2021 4:09:50 PM         Imaging Studies ordered: I ordered imaging studies including x-rays of the lower extremities, chest x-ray I independently visualized and interpreted imaging. I agree with the radiologist interpretation   Medicines ordered and prescription drug management: Meds ordered this encounter  Medications   nicotine (NICODERM CQ - dosed in mg/24 hours) patch 14 mg    -I have reviewed the patients home medicines and have made adjustments as needed  Critical interventions Antibiotics, sepsis work-up   Cardiac Monitoring: The patient was maintained on a cardiac monitor.  I personally viewed and interpreted the cardiac monitored which showed an underlying rhythm of: Normal sinus rhythm, sinus tachycardia  Social Determinants of Health:  Factors impacting patients care include: Severe self-neglect at home, need for social work intervention   Reevaluation: After the interventions noted above, I reevaluated the patient and found that they have :stayed the same  Co morbidities that complicate the patient evaluation History reviewed. No pertinent past medical history.    Dispostion: I considered admission for this patient, and due to inability to care for self at home with developing cellulitis, patient will require admission     Final Clinical Impression(s) / ED Diagnoses Final diagnoses:  None     @PCDICTATION @    Glendora ScoreKommor, Towana Stenglein, MD 07/14/21 1611

## 2021-07-14 NOTE — Assessment & Plan Note (Addendum)
--  per chart, totally dependent for ADL.  Not ambulatory in 3 to 4 years.   --Significant other has been patient's sole caregiver and reported caregiver burnout.

## 2021-07-14 NOTE — H&P (Signed)
History and Physical    Margaret Weber PYK:[id number] DOB: 1960/04/26 DOA: 07/14/2021  PCP: Patient, No Pcp Per (Inactive)   Patient coming from: Home  I have personally briefly reviewed patient's old medical records in [facility]  Chief Complaint: Leg swelling  HPI: Margaret Weber is a 61 y.o. female with medical history significant for COPD.  Patient was brought to the ED via EMS.  Presents with complaints of bilateral lower extremity swelling, with onset of pain today.    Patient significant other-who is a [employer], is at bedside.  Reports APS was called on his behalf due to caregiver burnout.  Significant other Margaret Weber is patient's sole caregiver.  Patient has not walked in at least 3 to 4 years, initially started after she fractured her forearm from a fall and subsequently she was afraid to walk due to fear of falls.  Significant other is hoping patient will be able to go to rehab or nursing home on discharge.  Patient's significant other reports that when patient was admitted in 2021 for COVID, her legs were skinny, over time her legs have become significantly swollen right worse than left, she reports onset of pain in her right lower extremity.  No fevers no chills.  No vomiting.  No urinary symptoms.  She reports wheezing, chronic unchanged cough.  She denies difficulty breathing.  She smokes 1 pack of cigarettes daily.    ED Course: 98.1.  Heart rate 104-111.  Respirate rate 15.  Blood pressure 130s to 144.  O2 sats greater than 92% on room air.  WBC 14.1.  Lactic acid 1.5.  X-ray of the right foot prominent soft tissue swelling of dorsal foot-possible hematoma or infection.   Left foot x-ray, portable chest x-ray, right and left tibial/fibula x-rays unremarkable. EKG shows sinus tachycardia. IV vancomycin started IV Lasix 40 mg x 1 given. Hospitalist admit for possible cellulitis.  Review of Systems: As per HPI all other systems reviewed and negative.  History  reviewed. No pertinent past medical history.  No past surgical history on file.   reports that she has been smoking cigarettes. She has been smoking an average of 1.5 packs per day. She has never used smokeless tobacco. She reports that she does not drink alcohol and does not use drugs.  No Known Allergies  Family history of hypertension.  Prior to Admission medications   Medication Sig Start Date End Date Taking? Authorizing Provider  oxyCODONE-acetaminophen (PERCOCET/ROXICET) 5-325 MG tablet Take 1 tablet by mouth every 4 (four) hours as needed for severe pain. 08/04/16   Rancour, Jeannett Senior, MD  predniSONE (DELTASONE) 50 MG tablet Take 1 tablet (50 mg total) by mouth 2 (two) times daily with a meal. 02/02/20   Catarina Hartshorn, MD    Physical Exam: Vitals:   07/14/21 1323 07/14/21 1330 07/14/21 1400  BP:  (!) 144/76 130/71  Pulse:  (!) 104 (!) 111  Resp:  15   Temp:  98.1 F (36.7 C)   TempSrc:  Oral   SpO2: 97% 96% 92%    Constitutional: NAD, calm, comfortable Vitals:   07/14/21 1323 07/14/21 1330 07/14/21 1400  BP:  (!) 144/76 130/71  Pulse:  (!) 104 (!) 111  Resp:  15   Temp:  98.1 F (36.7 C)   TempSrc:  Oral   SpO2: 97% 96% 92%   Eyes: PERRL, lids and conjunctivae normal ENMT: Mucous membranes are moist.  Neck: normal, supple, no masses, no thyromegaly Respiratory: Faint diffuse expiratory wheezing,  no crackles, normal respiratory effort. No accessory muscle use.  Cardiovascular: Tachycardic, regular rate and rhythm, no murmurs / rubs / gallops.  Bilateral lower extremity lymphedema Abdomen: no tenderness, no masses palpated. No hepatosplenomegaly. Bowel sounds positive.  Musculoskeletal: no clubbing / cyanosis. No joint deformity upper and lower extremities.  Skin: Poor hygiene bilateral lower extremities, stasis dermatitis, to bilateral lower extremity right worse than left, with induration, toe and fingernails are long.  Erythema to lower half of legs. Neurologic: No  apparent cranial nerve abnormality, moving extremities spontaneously.Marland Kitchen.  Psychiatric: Normal judgment and insight. Alert and oriented x 3. Normal mood.          Labs on Admission: I have personally reviewed following labs and imaging studies  CBC: Recent Labs  Lab 07/14/21 1450  WBC 14.1*  NEUTROABS 11.6*  HGB 14.0  HCT 46.0  MCV 98.7  PLT 265   Basic Metabolic Panel: Recent Labs  Lab 07/14/21 1450  NA 140  K 4.3  CL 103  CO2 28  GLUCOSE 120*  BUN 11  CREATININE 0.64  CALCIUM 9.2   GFR: CrCl cannot be calculated (Unknown ideal weight.). Liver Function Tests: Recent Labs  Lab 07/14/21 1450  AST 21  ALT 30  ALKPHOS 87  BILITOT 0.3  PROT 8.2*  ALBUMIN 4.1   Coagulation Profile: Recent Labs  Lab 07/14/21 1450  INR 1.0   Radiological Exams on Admission: DG Tibia/Fibula Left  Result Date: 07/14/2021 CLINICAL DATA:  Rule out osteo EXAM: LEFT TIBIA AND FIBULA - 2 VIEW COMPARISON:  None Available. FINDINGS: Negative for acute fracture. Soft tissue calcification lateral to the stuffy via likely due to chronic injury. No osteomyelitis. IMPRESSION: No acute skeletal abnormality. Electronically Signed   By: Marlan Palauharles  Clark M.D.   On: 07/14/2021 15:26   DG Tibia/Fibula Right  Result Date: 07/14/2021 CLINICAL DATA:  Rule out osteomyelitis EXAM: RIGHT TIBIA AND FIBULA - 2 VIEW COMPARISON:  None Available. FINDINGS: Negative for fracture or osteomyelitis. Diffuse soft tissue swelling. IMPRESSION: No acute skeletal abnormality.  Soft tissue swelling Electronically Signed   By: Marlan Palauharles  Clark M.D.   On: 07/14/2021 15:26   DG Chest Port 1 View  Result Date: 07/14/2021 CLINICAL DATA:  Question sepsis EXAM: PORTABLE CHEST 1 VIEW COMPARISON:  01/28/2020 FINDINGS: The heart size and mediastinal contours are within normal limits. Both lungs are clear. The visualized skeletal structures are unremarkable. IMPRESSION: No active disease. Electronically Signed   By: Marlan Palauharles  Clark  M.D.   On: 07/14/2021 15:23   DG Foot Complete Left  Result Date: 07/14/2021 CLINICAL DATA:  Question sepsis.  Rule out osteo EXAM: LEFT FOOT - COMPLETE 3+ VIEW COMPARISON:  None Available. FINDINGS: There is no evidence of fracture or dislocation. There is no evidence of arthropathy or other focal bone abnormality. Soft tissues are unremarkable. IMPRESSION: Negative. Electronically Signed   By: Marlan Palauharles  Clark M.D.   On: 07/14/2021 15:24   DG Foot Complete Right  Result Date: 07/14/2021 CLINICAL DATA:  Rule out osteo EXAM: RIGHT FOOT COMPLETE - 3+ VIEW COMPARISON:  None Available. FINDINGS: Negative for fracture.  No evidence of osteomyelitis Prominent soft tissue swelling on the dorsum of the foot overlying the metatarsals. No foreign body. IMPRESSION: Negative for osteomyelitis Prominent soft tissue swelling dorsal foot. Possible hematoma or infection. Electronically Signed   By: Marlan Palauharles  Clark M.D.   On: 07/14/2021 15:25    EKG: Independently reviewed.  Sinus tachycardia rate 107, QTc 454.  No significant change from prior.  Assessment/Plan Principal Problem:   Cellulitis Active Problems:   Tobacco abuse   Self neglect   Prediabetes   COPD (chronic obstructive pulmonary disease) (HCC)  Assessment and Plan: * Cellulitis Chronic appearing lymphedema, possibly acutely infected but hard to tell due to degree of lymphedema and chronic stasis changes R > L.  Onset of pain today.  Was seen for sepsis with leukocytosis of 14.1, and tachycardia heart rate 104 - 111.  X-ray of right foot-possible hematoma or infection. -Continue IV vancomycin -IV Lasix 40 mg x 1 given in ED -Check BNP -Obtain echocardiogram -CBC BMP -Follow-up blood cultures   COPD (chronic obstructive pulmonary disease) (HCC) Wheezing present, posterior denies difficulty breathing and cough is chronic and unchanged.  O2 sats greater than 92% on room air.  Ongoing tobacco abuse. -DuoNebs scheduled x2, continue as  needed -Patient and significant other reports psychologic adverse reaction to steroids - " patient becomes crazy"  Prediabetes - HgbA1c  Self neglect Totally dependent for activities of daily living.  Not ambulatory has not walked in 3 to 4 years.  Significant other has been patient's sole caregiver and reports caregiver burnout.  He is hoping the patient to be placed. -Social work consult - PT evaluation  Tobacco abuse Smokes a pack to a pack and a half of cigarettes daily. -Nicotine patch   DVT prophylaxis: Lovenox Code Status: Full Family Communication: Significant other at bedside Disposition Plan:  > 2 days Consults called: None Admission status: inpt tele I certify that at the point of admission it is my clinical judgment that the patient will require inpatient hospital care spanning beyond 2 midnights from the point of admission due to high intensity of service, high risk for further deterioration and high frequency of surveillance required.    Author: Onnie Boer, MD 07/14/2021 5:58 PM  For on call review [email or web address].

## 2021-07-14 NOTE — Progress Notes (Signed)
Pharmacy Antibiotic Note  Margaret Weber is a 61 y.o. female admitted on 07/14/2021 with cellulitis.  Pharmacy has been consulted for Vancomycin dosing.  Plan: Vancomycin 2000mg  IV loading dose, then 1500 mg IV Q 24 hrs. Goal AUC 400-550. Expected AUC: 492 SCr used: 0.8 (actual 0.64)   Height: 5\' 4"  (162.6 cm) Weight: 107.5 kg (237 lb) IBW/kg (Calculated) : 54.7  Temp (24hrs), Avg:98.1 F (36.7 C), Min:98.1 F (36.7 C), Max:98.1 F (36.7 C)  Recent Labs  Lab 07/14/21 1450  WBC 14.1*  CREATININE 0.64  LATICACIDVEN 1.5    Estimated Creatinine Clearance: 87.2 mL/min (by C-G formula based on SCr of 0.64 mg/dL).    No Known Allergies  Antimicrobials this admission: vancomycin 5/18 >>   Microbiology results: 5/18 BCx: pending 5/18 UCx: pending    Thank you for allowing pharmacy to be a part of this patient's care.  6/18, BS Pharm D, BCPS Clinical Pharmacist 07/14/2021 5:03 PM

## 2021-07-14 NOTE — Assessment & Plan Note (Addendum)
--   Without acute exacerbation.  No hypoxia.  Continue inhalers.  Recommend smoking cessation.

## 2021-07-14 NOTE — ED Notes (Signed)
[facility] Department of Social [facility] [phone].Have a case open on her.

## 2021-07-14 NOTE — ED Triage Notes (Signed)
PT in from [facility], pt was picked up at a private residence, SW contacted EMS d/t pt being an APS case, pt is c/o bil lower edema and multiple pressure ulcers on R lower extremities, A&O x4, per report the pt has been refusing care prior to today, per EMS the SW is hoping SNF placement, [facility] is involved with the case per EMS

## 2021-07-14 NOTE — Assessment & Plan Note (Addendum)
--   A complication of chronic lower extremity edema, ichthyosis-like skin, self-neglect.  Lymphedema questioned but rule out -- Treated initially with IV vancomycin, then transition to 2 oral antibiotics.  Cellulitis appears resolved.  Completed oral therapy 5/25.

## 2021-07-15 ENCOUNTER — Encounter (HOSPITAL_COMMUNITY): Payer: Self-pay | Admitting: Internal Medicine

## 2021-07-15 ENCOUNTER — Inpatient Hospital Stay (HOSPITAL_COMMUNITY): Payer: Medicaid Other

## 2021-07-15 DIAGNOSIS — R4689 Other symptoms and signs involving appearance and behavior: Secondary | ICD-10-CM | POA: Diagnosis not present

## 2021-07-15 DIAGNOSIS — R262 Difficulty in walking, not elsewhere classified: Secondary | ICD-10-CM | POA: Diagnosis not present

## 2021-07-15 DIAGNOSIS — I890 Lymphedema, not elsewhere classified: Secondary | ICD-10-CM

## 2021-07-15 DIAGNOSIS — L03119 Cellulitis of unspecified part of limb: Secondary | ICD-10-CM | POA: Diagnosis not present

## 2021-07-15 DIAGNOSIS — R5381 Other malaise: Secondary | ICD-10-CM

## 2021-07-15 DIAGNOSIS — J449 Chronic obstructive pulmonary disease, unspecified: Secondary | ICD-10-CM | POA: Diagnosis not present

## 2021-07-15 DIAGNOSIS — R600 Localized edema: Secondary | ICD-10-CM | POA: Diagnosis not present

## 2021-07-15 DIAGNOSIS — R7303 Prediabetes: Secondary | ICD-10-CM | POA: Diagnosis not present

## 2021-07-15 LAB — CBC
HCT: 42.2 % (ref 36.0–46.0)
HCT: 40.8 % (ref 36.0–46.0)
Hemoglobin: 13.2 g/dL (ref 12.0–15.0)
Hemoglobin: 13.1 g/dL (ref 12.0–15.0)
MCH: 30.4 pg (ref 26.0–34.0)
MCH: 30.8 pg (ref 26.0–34.0)
MCHC: 31.3 g/dL (ref 30.0–36.0)
MCHC: 32.1 g/dL (ref 30.0–36.0)
MCV: 97.2 fL (ref 80.0–100.0)
MCV: 96 fL (ref 80.0–100.0)
Platelets: 264 10*3/uL (ref 150–400)
Platelets: 251 10*3/uL (ref 150–400)
RBC: 4.34 MIL/uL (ref 3.87–5.11)
RBC: 4.25 MIL/uL (ref 3.87–5.11)
RDW: 14.3 % (ref 11.5–15.5)
RDW: 14.5 % (ref 11.5–15.5)
WBC: 14.2 10*3/uL — ABNORMAL HIGH (ref 4.0–10.5)
WBC: 12.7 10*3/uL — ABNORMAL HIGH (ref 4.0–10.5)
nRBC: 0 % (ref 0.0–0.2)
nRBC: 0 % (ref 0.0–0.2)

## 2021-07-15 LAB — ECHOCARDIOGRAM COMPLETE
AR max vel: 2.66 cm2
AV Area VTI: 3.08 cm2
AV Area mean vel: 2.41 cm2
AV Mean grad: 5 mmHg
AV Peak grad: 9.6 mmHg
Ao pk vel: 1.55 m/s
Area-P 1/2: 5.23 cm2
Calc EF: 61.4 %
Height: 64 in
MV VTI: 4.18 cm2
S' Lateral: 3.2 cm
Single Plane A2C EF: 64.7 %
Single Plane A4C EF: 56.6 %
Weight: 3792 oz

## 2021-07-15 LAB — BASIC METABOLIC PANEL
Anion gap: 9 (ref 5–15)
BUN: 10 mg/dL (ref 8–23)
CO2: 26 mmol/L (ref 22–32)
Calcium: 8.6 mg/dL — ABNORMAL LOW (ref 8.9–10.3)
Chloride: 104 mmol/L (ref 98–111)
Creatinine, Ser: 0.61 mg/dL (ref 0.44–1.00)
GFR, Estimated: >60 mL/min (ref >60)
Glucose, Bld: 118 mg/dL — ABNORMAL HIGH (ref 70–99)
Potassium: 3.7 mmol/L (ref 3.5–5.1)
Sodium: 139 mmol/L (ref 135–145)

## 2021-07-15 LAB — GLUCOSE, CAPILLARY
Glucose-Capillary: 138 mg/dL — ABNORMAL HIGH (ref 70–99)
Glucose-Capillary: 110 mg/dL — ABNORMAL HIGH (ref 70–99)

## 2021-07-15 LAB — HEMOGLOBIN A1C
Hgb A1c MFr Bld: 6.7 % — ABNORMAL HIGH (ref 4.8–5.6)
Mean Plasma Glucose: 145.59 mg/dL

## 2021-07-15 LAB — HIV ANTIBODY (ROUTINE TESTING W REFLEX): HIV Screen 4th Generation wRfx: NONREACTIVE

## 2021-07-15 MED ORDER — MELATONIN 3 MG PO TABS
6.0000 mg | ORAL_TABLET | Freq: Once | ORAL | Status: AC
  Administered 2021-07-15: 6 mg via ORAL
  Filled 2021-07-15: qty 2

## 2021-07-15 MED ORDER — LIVING WELL WITH DIABETES BOOK: Freq: Once | Status: AC

## 2021-07-15 MED ORDER — INSULIN ASPART 100 UNIT/ML IJ SOLN: 0.0000 [IU] | Freq: Every day | INTRAMUSCULAR | Status: DC

## 2021-07-15 MED ORDER — NYSTATIN 100000 UNIT/GM EX POWD
1.0000 "application " | Freq: Three times a day (TID) | CUTANEOUS | Status: DC
  Administered 2021-07-15 – 2021-07-22 (×23): 1 via TOPICAL
  Filled 2021-07-15 (×2): qty 15

## 2021-07-15 MED ORDER — PANTOPRAZOLE SODIUM 40 MG PO TBEC
40.0000 mg | DELAYED_RELEASE_TABLET | Freq: Two times a day (BID) | ORAL | Status: DC
  Administered 2021-07-15 – 2021-07-22 (×15): 40 mg via ORAL
  Filled 2021-07-15 (×15): qty 1

## 2021-07-15 MED ORDER — NICOTINE POLACRILEX 2 MG MT GUM
2.0000 mg | CHEWING_GUM | OROMUCOSAL | Status: DC | PRN
  Administered 2021-07-15 – 2021-07-21 (×3): 2 mg via ORAL
  Filled 2021-07-15 (×5): qty 1

## 2021-07-15 MED ORDER — INSULIN ASPART 100 UNIT/ML IJ SOLN
0.0000 [IU] | Freq: Three times a day (TID) | INTRAMUSCULAR | Status: DC
  Administered 2021-07-15 – 2021-07-17 (×6): 1 [IU] via SUBCUTANEOUS
  Administered 2021-07-18: 2 [IU] via SUBCUTANEOUS
  Administered 2021-07-18: 1 [IU] via SUBCUTANEOUS
  Administered 2021-07-18: 2 [IU] via SUBCUTANEOUS
  Administered 2021-07-19 (×2): 1 [IU] via SUBCUTANEOUS
  Administered 2021-07-19 – 2021-07-20 (×2): 2 [IU] via SUBCUTANEOUS
  Administered 2021-07-20 – 2021-07-21 (×3): 1 [IU] via SUBCUTANEOUS
  Administered 2021-07-21: 2 [IU] via SUBCUTANEOUS
  Administered 2021-07-21 – 2021-07-22 (×2): 1 [IU] via SUBCUTANEOUS
  Administered 2021-07-22: 5 [IU] via SUBCUTANEOUS

## 2021-07-15 MED ORDER — NICOTINE 21 MG/24HR TD PT24
21.0000 mg | MEDICATED_PATCH | Freq: Every day | TRANSDERMAL | Status: DC
Start: 2021-07-15 — End: 2021-07-22
  Administered 2021-07-15 – 2021-07-22 (×8): 21 mg via TRANSDERMAL
  Filled 2021-07-15 (×8): qty 1

## 2021-07-15 NOTE — Plan of Care (Signed)
  Problem: Acute Rehab PT Goals(only PT should resolve) Goal: Pt Will Go Supine/Side To Sit Outcome: Progressing Goal: Pt Will Go Sit To Supine/Side Outcome: Progressing Flowsheets (Taken 07/15/2021 0945) Pt will go Sit to Supine/Side:  with moderate assist  with minimal assist Goal: Patient Will Transfer Sit To/From Stand Outcome: Progressing Flowsheets (Taken 07/15/2021 0945) Patient will transfer sit to/from stand:  with minimal assist  with moderate assist Goal: Pt Will Transfer Bed To Chair/Chair To Bed Outcome: Progressing Flowsheets (Taken 07/15/2021 0945) Pt will Transfer Bed to Chair/Chair to Bed: with mod assist Goal: Pt/caregiver will Perform Home Exercise Program Outcome: Progressing Flowsheets (Taken 07/15/2021 0945) Pt/caregiver will Perform Home Exercise Program:  For increased strengthening  For improved balance  Independently  9:46 AM, 07/15/21 Mearl Latin PT, DPT Physical Therapist at [facility]

## 2021-07-15 NOTE — TOC Initial Note (Signed)
Transition of Care [facility]) - Initial/Assessment Note    Patient Details  Name: Margaret Weber MRN: [id number] Date of Birth: 06/10/1960  Transition of Care [facility]) CM/SW Contact:    Villa Herb, LCSWA Phone Number: 07/15/2021, 12:32 PM  Clinical Narrative:                 CSW spoke with pt in room about PT recommending SNF at D/C. Pt states that she is agreeable to SNF. Pt would like to go to a SNF that will allow her to smoke in a smoking area. CSW sent referral out. TOC to follow.   Expected Discharge Plan: Skilled Nursing Facility Barriers to Discharge: Continued Medical Work up   Patient Goals and CMS Choice Patient states their goals for this hospitalization and ongoing recovery are:: go to SNF CMS Medicare.gov Compare Post Acute Care list provided to:: Patient Choice offered to / list presented to : Patient  Expected Discharge Plan and Services Expected Discharge Plan: Skilled Nursing Facility In-house Referral: Clinical Social Work   Post Acute Care Choice: Skilled Nursing Facility Living arrangements for the past 2 months: Single Family Home                                      Prior Living Arrangements/Services Living arrangements for the past 2 months: Single Family Home Lives with:: Significant Other Patient language and need for interpreter reviewed:: Yes        Need for Family Participation in Patient Care: Yes (Comment) Care giver support system in place?: Yes (comment)   Criminal Activity/Legal Involvement Pertinent to Current Situation/Hospitalization: No - Comment as needed  Activities of Daily Living Home Assistive Devices/Equipment: None ADL Screening (condition at time of admission) Patient's cognitive ability adequate to safely complete daily activities?: Yes Is the patient deaf or have difficulty hearing?: No Does the patient have difficulty seeing, even when wearing glasses/contacts?: No Does the patient have difficulty concentrating,  remembering, or making decisions?: No Patient able to express need for assistance with ADLs?: Yes Does the patient have difficulty dressing or bathing?: Yes Independently performs ADLs?: No Communication: Independent Dressing (OT): Dependent Is this a change from baseline?: Pre-admission baseline Grooming: Dependent Is this a change from baseline?: Pre-admission baseline Feeding: Independent Bathing: Dependent Is this a change from baseline?: Pre-admission baseline Toileting: Dependent Is this a change from baseline?: Pre-admission baseline In/Out Bed: Dependent Is this a change from baseline?: Pre-admission baseline Walks in Home: Dependent Is this a change from baseline?: Pre-admission baseline Does the patient have difficulty walking or climbing stairs?: Yes Weakness of Legs: Both Weakness of Arms/Hands: Both  Permission Sought/Granted                  Emotional Assessment Appearance:: Appears stated age Attitude/Demeanor/Rapport: Engaged Affect (typically observed): Accepting Orientation: : Oriented to Self, Oriented to Place, Oriented to  Time, Oriented to Situation Alcohol / Substance Use: Not Applicable Psych Involvement: No (comment)  Admission diagnosis:  Cellulitis [L03.90] Patient Active Problem List   Diagnosis Date Noted   Ambulatory dysfunction 07/15/2021   Lymphedema 07/15/2021   Debility 07/15/2021   Cellulitis 07/14/2021   COPD (chronic obstructive pulmonary disease) (HCC) 07/14/2021   COPD with acute exacerbation (HCC) 02/01/2020   Prediabetes 01/31/2020   small pericardial effusion 01/31/2020   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   Tobacco abuse 01/29/2020   Self neglect 01/29/2020   COVID-19  Hypoxia    Pleural effusion    Acute hypoxemic respiratory failure due to COVID-19 [facility]) 01/28/2020   PCP:  Patient, No Pcp Per (Inactive) Pharmacy:   [facility] [address], [place] - [address] [place]  [address] Phone: [phone] Fax: [phone]     Social Determinants of Health (SDOH) Interventions    Readmission Risk Interventions     View : No data to display.

## 2021-07-15 NOTE — Evaluation (Signed)
Clinical/Bedside Swallow Evaluation Patient Details  Name: Margaret Weber MRN: [id number] Date of Birth: [birth year]  Today's Date: 07/15/2021 Time: SLP Start Time (ACUTE ONLY): 1539 SLP Stop Time (ACUTE ONLY): 1553 SLP Time Calculation (min) (ACUTE ONLY): 14 min  Past Medical History:  Past Medical History:  Diagnosis Date   COPD (chronic obstructive pulmonary disease) (HCC)    Lymphedema    Past Surgical History: No past surgical history on file. HPI:  Margaret Weber is a 62 y.o. female with past medical history of COPD lymphedema presented to hospital with bilateral lower extremity swelling and pain.  Patient significant other-who is a [employer], had called in APS due to caregiver burnout.  Of note, as per the admitting provider patient has not walked in at least 3 to 4 years due to fear of falls.  Patient has been having worsening swelling of her lower extremities since 2021.  She continues to smoke at home.  Significant other is hoping patient will be able to go to rehab or nursing home on discharge.  In the ED, patient had mild tachycardia.  WBC at 14.1.  Lactate at 1.5.  X-ray of the right foot showed soft tissue swelling.  X-ray of the chest, left foot was unremarkable.  EKG showed sinus tachycardia.  Patient was given Lasix IV vancomycin for possible cellulitis and was admitted to hospital for further evaluation and treatment. BSE requested    Assessment / Plan / Recommendation  Clinical Impression  Clinical swallowing evaluation completed while Pt was sitting upright in bed; Pt consumed regular textures and thin liquids without overt s/sx of oropharyngeal dysphagia. Pt was pleasant and cooperative and denies swallowing difficulty. Recommend continue with regular textures and thin liquids; meds are ok whole with liquids. SLP reviewed universal aspiration precautions with Pt. No further ST needs noted at this time, ST will sign off. Thank you, SLP Visit Diagnosis: Dysphagia,  unspecified (R13.10)    Aspiration Risk       Diet Recommendation Regular;Thin liquid   Liquid Administration via: Cup;Straw Medication Administration: Whole meds with liquid Supervision: Patient able to self feed;Intermittent supervision to cue for compensatory strategies Compensations: Minimize environmental distractions;Slow rate;Small sips/bites    Other  Recommendations Oral Care Recommendations: Oral care BID    Recommendations for follow up therapy are one component of a multi-disciplinary discharge planning process, led by the attending physician.  Recommendations may be updated based on patient status, additional functional criteria and insurance authorization.  Follow up Recommendations No SLP follow up        Swallow Study   General HPI: Margaret Weber is a 61 y.o. female with past medical history of COPD lymphedema presented to hospital with bilateral lower extremity swelling and pain.  Patient significant other-who is a [employer], had called in APS due to caregiver burnout.  Of note, as per the admitting provider patient has not walked in at least 3 to 4 years due to fear of falls.  Patient has been having worsening swelling of her lower extremities since 2021.  She continues to smoke at home.  Significant other is hoping patient will be able to go to rehab or nursing home on discharge.  In the ED, patient had mild tachycardia.  WBC at 14.1.  Lactate at 1.5.  X-ray of the right foot showed soft tissue swelling.  X-ray of the chest, left foot was unremarkable.  EKG showed sinus tachycardia.  Patient was given Lasix IV vancomycin for possible cellulitis and was admitted  to hospital for further evaluation and treatment. BSE requested Type of Study: Bedside Swallow Evaluation Previous Swallow Assessment: none in chart Diet Prior to this Study: Regular;Thin liquids Temperature Spikes Noted: No History of Recent Intubation: No Behavior/Cognition:  Alert;Cooperative;Pleasant mood Oral Cavity Assessment: Within Functional Limits Oral Care Completed by SLP: Recent completion by staff Oral Cavity - Dentition: Adequate natural dentition;Missing dentition;Poor condition Vision: Functional for self-feeding Self-Feeding Abilities: Able to feed self;Needs set up Patient Positioning: Upright in bed Baseline Vocal Quality: Normal Volitional Cough: Strong Volitional Swallow: Able to elicit    Oral/Motor/Sensory Function Overall Oral Motor/Sensory Function: Within functional limits   Ice Chips Ice chips: Within functional limits   Thin Liquid Thin Liquid: Within functional limits    Nectar Thick Nectar Thick Liquid: Not tested   Honey Thick Honey Thick Liquid: Not tested   Puree Puree: Within functional limits   Solid     Solid: Within functional limits     Margaret Weber H. Romie Levee, CCC-SLP Speech Language Pathologist  Georgetta Haber 07/15/2021,3:54 PM

## 2021-07-15 NOTE — NC FL2 (Signed)
[place] MEDICAID FL2 LEVEL OF CARE SCREENING TOOL     IDENTIFICATION  Patient Name: Margaret Weber Birthdate: 04-28-1960 Sex: female Admission Date (Current Location): 07/14/2021  [facility] and [place] Number:  [employer] and Address:  [facility],  [address] [place] [address]      Provider Number: [id number]  Attending Physician Name and Address:  Joycelyn Das, MD  Relative Name and Phone Number:       Current Level of Care: Hospital Recommended Level of Care: Skilled Nursing Facility Prior Approval Number:    Date Approved/Denied:   PASRR Number: [id number]  Discharge Plan: SNF    Current Diagnoses: Patient Active Problem List   Diagnosis Date Noted   Ambulatory dysfunction 07/15/2021   Lymphedema 07/15/2021   Debility 07/15/2021   Cellulitis 07/14/2021   COPD (chronic obstructive pulmonary disease) (HCC) 07/14/2021   COPD with acute exacerbation (HCC) 02/01/2020   Prediabetes 01/31/2020   small pericardial effusion 01/31/2020   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   Tobacco abuse 01/29/2020   Self neglect 01/29/2020   COVID-19    Hypoxia    Pleural effusion    Acute hypoxemic respiratory failure due to COVID-19 (HCC) 01/28/2020    Orientation RESPIRATION BLADDER Height & Weight     Self, Time, Place, Situation  Normal Continent, External catheter Weight: 237 lb (107.5 kg) Height:  5\' 4"  (162.6 cm)  BEHAVIORAL SYMPTOMS/MOOD NEUROLOGICAL BOWEL NUTRITION STATUS      Continent Diet (Heart Healthy)  AMBULATORY STATUS COMMUNICATION OF NEEDS Skin   Extensive Assist Verbally Normal                       Personal Care Assistance Level of Assistance  Bathing, Feeding, Dressing Bathing Assistance: Maximum assistance Feeding assistance: Independent Dressing Assistance: Maximum assistance     Functional Limitations Info             SPECIAL CARE FACTORS FREQUENCY  PT (By licensed PT), OT (By licensed OT)     PT  Frequency: 5 times weekly OT Frequency: 5 times weekly            Contractures Contractures Info: Not present    Additional Factors Info  Code Status, Allergies Code Status Info: FULL Allergies Info: NKA           Current Medications (07/15/2021):  This is the current hospital active medication list Current Facility-Administered Medications  Medication Dose Route Frequency Provider Last Rate Last Admin   acetaminophen (TYLENOL) tablet 650 mg  650 mg Oral Q6H PRN Emokpae, Ejiroghene E, MD       Or   acetaminophen (TYLENOL) suppository 650 mg  650 mg Rectal Q6H PRN Emokpae, Ejiroghene E, MD       enoxaparin (LOVENOX) injection 50 mg  50 mg Subcutaneous Q24H Emokpae, Ejiroghene E, MD   50 mg at 07/14/21 2120   ipratropium-albuterol (DUONEB) 0.5-2.5 (3) MG/3ML nebulizer solution 3 mL  3 mL Nebulization Q4H PRN Emokpae, Ejiroghene E, MD       ipratropium-albuterol (DUONEB) 0.5-2.5 (3) MG/3ML nebulizer solution 3 mL  3 mL Nebulization BID Emokpae, Ejiroghene E, MD   3 mL at 07/15/21 0939   nicotine (NICODERM CQ - dosed in mg/24 hours) patch 14 mg  14 mg Transdermal Once Kommor, [place], MD   14 mg at 07/14/21 1625   nystatin (MYCOSTATIN/NYSTOP) topical powder 1 application.  1 application. Topical TID 07/16/21, MD   1 application. at 07/15/21 1134  ondansetron (ZOFRAN) tablet 4 mg  4 mg Oral Q6H PRN Emokpae, Ejiroghene E, MD       Or   ondansetron (ZOFRAN) injection 4 mg  4 mg Intravenous Q6H PRN Emokpae, Ejiroghene E, MD       pantoprazole (PROTONIX) EC tablet 40 mg  40 mg Oral BID Pokhrel, Laxman, MD   40 mg at 07/15/21 1132   polyethylene glycol (MIRALAX / GLYCOLAX) packet 17 g  17 g Oral Daily PRN Emokpae, Ejiroghene E, MD       vancomycin (VANCOREADY) IVPB 1500 mg/300 mL  1,500 mg Intravenous Q24H Emokpae, Ejiroghene E, MD         Discharge Medications: Please see discharge summary for a list of discharge medications.  Relevant Imaging Results:  Relevant Lab  Results:   Additional Information SSN: [id number] [place]

## 2021-07-15 NOTE — Progress Notes (Signed)
*  PRELIMINARY RESULTS* Echocardiogram 2D Echocardiogram has been performed.  Margaret Weber 07/15/2021, 11:32 AM

## 2021-07-15 NOTE — Progress Notes (Signed)
Inpatient Diabetes Program Recommendations  AACE/ADA: New Consensus Statement on Inpatient Glycemic Control (2015)  Target Ranges:  Prepandial:   less than 140 mg/dL      Peak postprandial:   less than 180 mg/dL (1-2 hours)      Critically ill patients:  140 - 180 mg/dL   Lab Results  Component Value Date   GLUCAP 174 (H) 02/02/2020   HGBA1C 6.7 (H) 07/15/2021    Review of Glycemic Control  Diabetes history: type 2 (ne diagnosis) Outpatient Diabetes medications: none Current orders for Inpatient glycemic control: Novolog SENSITIVE correction scale TID & Novolog 0-5 units HS scale  Inpatient Diabetes Program Recommendations:   Received diabetes coordinator consult. Spoke with patient on her cell phone about the new onset of diabetes. States that she has never been told that she has high blood sugars. Does not have a PCP or have seen a doctor in a long time. Reviewed her HgbA1C of 6.7% and that 6.5% is a diagnosis of diabetes. Discussed normal blood sugar.   Patient states that she will not take insulin at home. She states that she will not check blood sugars at home. She does not like to stick herself. Reviewed what she eats during the day. She eats 2 meals a day, likes her regular soda, water, and coffee with sugar. Reviewed the "plate method" with her. Ordered Living Well with Diabetes booklet for her to take home with her.   Will continue to monitor blood sugars while in the hospital.  Harvel Ricks RN BSN CDE Diabetes Coordinator Pager: [phone]  8am-5pm

## 2021-07-15 NOTE — Evaluation (Signed)
Physical Therapy Evaluation Patient Details Name: Margaret Weber MRN: [id number] DOB: 11/25/1960 Today's Date: 07/15/2021  History of Present Illness  Margaret Weber is a 61 y.o. female with medical history significant for COPD.  Patient was brought to the ED via EMS.  Presents with complaints of bilateral lower extremity swelling, with onset of pain today.    Clinical Impression  Patient limited for functional mobility as stated below secondary to BLE weakness, fatigue and poor standing balance. Patient requires mod/max assist with mobility secondary to generalized weakness and balance deficits. She requires increased time, cueing, and reassurance with mobility. She transfers to standing x 5 with use of RW and assist but was unable to ambulate to chair at bedside due to weakness and fear of falling. Repositioned in bed at end of session. Patient will benefit from continued physical therapy in hospital and recommended venue below to increase strength, balance, endurance for safe ADLs and gait.        Recommendations for follow up therapy are one component of a multi-disciplinary discharge planning process, led by the attending physician.  Recommendations may be updated based on patient status, additional functional criteria and insurance authorization.  Follow Up Recommendations Skilled nursing-short term rehab (<3 hours/day)    Assistance Recommended at Discharge Frequent or constant Supervision/Assistance  Patient can return home with the following  A lot of help with walking and/or transfers;A lot of help with bathing/dressing/bathroom;Assistance with cooking/housework;Help with stairs or ramp for entrance;Assist for transportation    Equipment Recommendations None recommended by PT  Recommendations for Other Services       Functional Status Assessment Patient has had a recent decline in their functional status and demonstrates the ability to make significant improvements in function  in a reasonable and predictable amount of time.     Precautions / Restrictions Precautions Precautions: Fall Restrictions Weight Bearing Restrictions: No      Mobility  Bed Mobility Overal bed mobility: Needs Assistance Bed Mobility: Supine to Sit, Sit to Supine     Supine to sit: Mod assist, HOB elevated Sit to supine: Max assist   General bed mobility comments: assist for LE movement and uprighting trunk with frequent cueing required    Transfers Overall transfer level: Needs assistance Equipment used: Rolling walker (2 wheels) Transfers: Sit to/from Stand Sit to Stand: Mod assist, Max assist           General transfer comment: transfer to standing x 5 with RW, assist for weakness and cueing for sequencing with RW, requires increased time    Ambulation/Gait                  Stairs            Wheelchair Mobility    Modified Rankin (Stroke Patients Only)       Balance Overall balance assessment: Needs assistance Sitting-balance support: No upper extremity supported, Feet supported Sitting balance-Leahy Scale: Fair Sitting balance - Comments: seated EOB   Standing balance support: Bilateral upper extremity supported, Reliant on assistive device for balance Standing balance-Leahy Scale: Poor                               Pertinent Vitals/Pain Pain Assessment Pain Assessment: Faces Faces Pain Scale: Hurts even more Pain Location: back and bilateral legs Pain Descriptors / Indicators: Sore Pain Intervention(s): Limited activity within patient's tolerance, Monitored during session, Repositioned    Home Living Family/patient expects to be  discharged to:: Private residence Living Arrangements: Spouse/significant other Available Help at Discharge: Family Type of Home: House Home Access: Ramped entrance       Home Layout: One level Home Equipment: Firefighter;Wheelchair - manual;Shower seat      Prior Function Prior  Level of Function : Needs assist             Mobility Comments: Patient states household ambulation with assist ADLs Comments: assisted by family     Hand Dominance        Extremity/Trunk Assessment   Upper Extremity Assessment Upper Extremity Assessment: Generalized weakness    Lower Extremity Assessment Lower Extremity Assessment: Generalized weakness    Cervical / Trunk Assessment Cervical / Trunk Assessment: Normal  Communication   Communication: No difficulties  Cognition Arousal/Alertness: Awake/alert Behavior During Therapy: WFL for tasks assessed/performed Overall Cognitive Status: Within Functional Limits for tasks assessed                                          General Comments      Exercises     Assessment/Plan    PT Assessment Patient needs continued PT services  PT Problem List Decreased strength;Decreased mobility;Decreased activity tolerance;Decreased balance;Pain;Obesity       PT Treatment Interventions DME instruction;Therapeutic exercise;Gait training;Balance training;Stair training;Neuromuscular re-education;Functional mobility training;Therapeutic activities;Patient/family education    PT Goals (Current goals can be found in the Care Plan section)  Acute Rehab PT Goals Patient Stated Goal: Return home PT Goal Formulation: With patient Time For Goal Achievement: 07/29/21 Potential to Achieve Goals: Fair    Frequency Min 3X/week     Co-evaluation               AM-PAC PT "6 Clicks" Mobility  Outcome Measure Help needed turning from your back to your side while in a flat bed without using bedrails?: A Little Help needed moving from lying on your back to sitting on the side of a flat bed without using bedrails?: A Lot Help needed moving to and from a bed to a chair (including a wheelchair)?: A Lot Help needed standing up from a chair using your arms (e.g., wheelchair or bedside chair)?: A Lot Help needed to  walk in hospital room?: A Lot Help needed climbing 3-5 steps with a railing? : A Lot 6 Click Score: 13    End of Session Equipment Utilized During Treatment: Gait belt Activity Tolerance: Patient limited by fatigue Patient left: in bed;with call bell/phone within reach;with bed alarm set Nurse Communication: Mobility status PT Visit Diagnosis: Unsteadiness on feet (R26.81);Other abnormalities of gait and mobility (R26.89);Muscle weakness (generalized) (M62.81);History of falling (Z91.81)    Time: 3235-5732 PT Time Calculation (min) (ACUTE ONLY): 44 min   Charges:   PT Evaluation $PT Eval Low Complexity: 1 Low PT Treatments $Therapeutic Activity: 38-52 mins        9:44 AM, 07/15/21 Wyman Songster PT, DPT Physical Therapist at [facility]

## 2021-07-15 NOTE — Hospital Course (Addendum)
61 year old woman PMH COPD, self-neglect, open APS case, no PCP or outpatient care in some time, presented from home via EMS, secondary to severe self-neglect, caregiver burnout and open APS case.  Admitted for cellulitis.  Diuresed well.  Given social situation and PT recommendations, plan for SNF, remains medically stable from 5/24, await insurance authorization.

## 2021-07-15 NOTE — Progress Notes (Addendum)
PROGRESS NOTE    Margaret Weber  MBE:[id number] DOB: [birth year] DOA: 07/14/2021 PCP: Patient, No Pcp Per (Inactive)    Brief Narrative:  Margaret Weber is a 61 y.o. female with past medical history of COPD lymphedema presented to hospital with bilateral lower extremity swelling and pain.  Patient significant other-who is a [employer], had called in APS due to caregiver burnout.  Of note, as per the admitting provider patient has not walked in at least 3 to 4 years due to fear of falls.  Patient has been having worsening swelling of her lower extremities since 2021.  She continues to smoke at home.  Significant other is hoping patient will be able to go to rehab or nursing home on discharge.  In the ED, patient had mild tachycardia.  WBC at 14.1.  Lactate at 1.5.  X-ray of the right foot showed soft tissue swelling.  X-ray of the chest, left foot was unremarkable.  EKG showed sinus tachycardia.  Patient was given Lasix IV vancomycin for possible cellulitis and was admitted to hospital for further evaluation and treatment.  Assessment and Plan:  Principal Problem:   Cellulitis Active Problems:   Tobacco abuse   Self neglect   Prediabetes   COPD (chronic obstructive pulmonary disease) (HCC)   Ambulatory dysfunction   Lymphedema   Debility   * Cellulitis With the background of lymphedema. Sepsis ruled out.  Received IV vancomycin.  Continue Lasix.  Blood cultures negative less than 24 hours.  Check 2D echocardiogram due to lower extremity edema.   Elevated ESR, CRP of 1.3.   COPD (chronic obstructive pulmonary disease) ([place]) Wheezing present on admission.  Ongoing tobacco abuse.  Continue supplemental oxygen, DuoNebs.  Does not tolerate steroids as per the patient    Diabetes mellitus type 2. History of prediabetes.  Hemoglobin A1c of 6.7 at this time.  Qualifies for type 2 diabetes.  Add sliding scale insulin, will consult diabetic coordinator.  Diabetic education.  Diabetic  diet.   Ambulatory dysfunction Possibility of self-neglect.  Totally dependent for activities of daily living.  Not ambulatory has not walked in 3 to 4 years.  Significant other has been patient's sole caregiver and reports caregiver burnout.  Physical therapy evaluation today recommended skilled nursing facility placement.   Tobacco abuse Smokes a pack to a pack and a half of cigarettes daily.  Continue nicotine patch    DVT prophylaxis:   Lovenox subcu   Code Status:     Code Status: Full Code  Disposition: Skilled nursing facility  Status is: Inpatient  Remains inpatient appropriate because: Need for rehabilitation, IV vancomycin   Family Communication: Communicated with the patient at bedside  Consultants:  None  Procedures:  None  Antimicrobials:  Vancomycin IV  Anti-infectives (From admission, onward)    Start     Dose/Rate Route Frequency Ordered Stop   07/15/21 1700  vancomycin (VANCOREADY) IVPB 1500 mg/300 mL        1,500 mg 150 mL/hr over 120 Minutes Intravenous Every 24 hours 07/14/21 1703     07/14/21 1700  vancomycin (VANCOREADY) IVPB 2000 mg/400 mL        2,000 mg 200 mL/hr over 120 Minutes Intravenous  Once 07/14/21 1646 07/14/21 2024      Subjective: Today, patient was seen and examined at bedside.  Denies any pain over the lower extremities.  Complains of no chest pain or shortness of breath has bilateral leg redness and swelling.  Objective: Vitals:   07/15/21  0507 07/15/21 0940 07/15/21 0959 07/15/21 1217  BP: 105/60  130/69 119/64  Pulse: (!) 104  98 95  Resp: 18  20 18   Temp:   98.2 F (36.8 C) 98.3 F (36.8 C)  TempSrc:   Oral Oral  SpO2: 90% 90% 90% 90%  Weight:      Height:        Intake/Output Summary (Last 24 hours) at 07/15/2021 1545 Last data filed at 07/15/2021 1500 Gross per 24 hour  Intake 760 ml  Output 1500 ml  Net -740 ml   Filed Weights   07/14/21 1631  Weight: 107.5 kg    Physical Examination: Body mass index  is 40.68 kg/m.  General: Morbidly obese built, not in obvious distress HENT:   No scleral pallor or icterus noted. Oral mucosa is moist.  Chest:   Diminished breath sounds bilaterally.  Mild wheezing noted. CVS: S1 &S2 heard. No murmur.  Regular rate and rhythm. Abdomen: Soft, nontender, nondistended.  Bowel sounds are heard.   Extremities: Bilateral lower extremity lymphedema with erythema induration. CNS:  No cranial nerve deficits.  Power equal in all extremities.   Skin: Warm and dry.  Bilateral lower extremity erythema induration and ymphedema.    Data Reviewed:   CBC: Recent Labs  Lab 07/14/21 1450 07/15/21 0430 07/15/21 1210  WBC 14.1* 14.2* 12.7*  NEUTROABS 11.6*  --   --   HGB 14.0 13.2 13.1  HCT 46.0 42.2 40.8  MCV 98.7 97.2 96.0  PLT 265 264 863    Basic Metabolic Panel: Recent Labs  Lab 07/14/21 1450 07/15/21 0430  NA 140 139  K 4.3 3.7  CL 103 104  CO2 28 26  GLUCOSE 120* 118*  BUN 11 10  CREATININE 0.64 0.61  CALCIUM 9.2 8.6*    Liver Function Tests: Recent Labs  Lab 07/14/21 1450  AST 21  ALT 30  ALKPHOS 87  BILITOT 0.3  PROT 8.2*  ALBUMIN 4.1     Radiology Studies: DG Tibia/Fibula Left  Result Date: 07/14/2021 CLINICAL DATA:  Rule out osteo EXAM: LEFT TIBIA AND FIBULA - 2 VIEW COMPARISON:  None Available. FINDINGS: Negative for acute fracture. Soft tissue calcification lateral to the stuffy via likely due to chronic injury. No osteomyelitis. IMPRESSION: No acute skeletal abnormality. Electronically Signed   By: Franchot Gallo M.D.   On: 07/14/2021 15:26   DG Tibia/Fibula Right  Result Date: 07/14/2021 CLINICAL DATA:  Rule out osteomyelitis EXAM: RIGHT TIBIA AND FIBULA - 2 VIEW COMPARISON:  None Available. FINDINGS: Negative for fracture or osteomyelitis. Diffuse soft tissue swelling. IMPRESSION: No acute skeletal abnormality.  Soft tissue swelling Electronically Signed   By: Franchot Gallo M.D.   On: 07/14/2021 15:26   DG Chest Port 1  View  Result Date: 07/14/2021 CLINICAL DATA:  Question sepsis EXAM: PORTABLE CHEST 1 VIEW COMPARISON:  01/28/2020 FINDINGS: The heart size and mediastinal contours are within normal limits. Both lungs are clear. The visualized skeletal structures are unremarkable. IMPRESSION: No active disease. Electronically Signed   By: Franchot Gallo M.D.   On: 07/14/2021 15:23   DG Foot Complete Left  Result Date: 07/14/2021 CLINICAL DATA:  Question sepsis.  Rule out osteo EXAM: LEFT FOOT - COMPLETE 3+ VIEW COMPARISON:  None Available. FINDINGS: There is no evidence of fracture or dislocation. There is no evidence of arthropathy or other focal bone abnormality. Soft tissues are unremarkable. IMPRESSION: Negative. Electronically Signed   By: Franchot Gallo M.D.   On: 07/14/2021  15:24   DG Foot Complete Right  Result Date: 07/14/2021 CLINICAL DATA:  Rule out osteo EXAM: RIGHT FOOT COMPLETE - 3+ VIEW COMPARISON:  None Available. FINDINGS: Negative for fracture.  No evidence of osteomyelitis Prominent soft tissue swelling on the dorsum of the foot overlying the metatarsals. No foreign body. IMPRESSION: Negative for osteomyelitis Prominent soft tissue swelling dorsal foot. Possible hematoma or infection. Electronically Signed   By: Franchot Gallo M.D.   On: 07/14/2021 15:25   ECHOCARDIOGRAM COMPLETE  Result Date: 07/15/2021    ECHOCARDIOGRAM REPORT   Patient Name:   Margaret Weber Date of Exam: 07/15/2021 Medical Rec #:  [id number]       Height:       64.0 in Accession #:    [id number]      Weight:       237.0 lb Date of Birth:  Apr 22, 1960       BSA:          2.103 m Patient Age:    48 years        BP:           105/60 mmHg Patient Gender: F               HR:           96 bpm. Exam Location:  [facility] Procedure: 2D Echo, Cardiac Doppler and Color Doppler Indications:    Bilateral lower extremity edema  History:        Patient has prior history of Echocardiogram examinations, most                 recent 01/30/2020. COPD;  Risk Factors:Current Smoker.  Sonographer:    Wenda Low Referring Phys: [phone] Leanne Chang [facility]  Sonographer Comments: Patient is morbidly obese. IMPRESSIONS  1. Left ventricular ejection fraction, by estimation, is 60 to 65%. The left ventricle has normal function. The left ventricle has no regional wall motion abnormalities. There is moderate concentric left ventricular hypertrophy. Left ventricular diastolic parameters are consistent with Grade I diastolic dysfunction (impaired relaxation).  2. Right ventricular systolic function is normal. The right ventricular size is normal. Tricuspid regurgitation signal is inadequate for assessing PA pressure.  3. A small pericardial effusion is present. The pericardial effusion is circumferential.  4. The mitral valve is normal in structure. No evidence of mitral valve regurgitation. No evidence of mitral stenosis.  5. The aortic valve was not well visualized. Aortic valve regurgitation is not visualized. Comparison(s): No significant change from prior study. Prior images reviewed side by side. FINDINGS  Left Ventricle: Left ventricular ejection fraction, by estimation, is 60 to 65%. The left ventricle has normal function. The left ventricle has no regional wall motion abnormalities. The left ventricular internal cavity size was normal in size. There is  moderate concentric left ventricular hypertrophy. Left ventricular diastolic parameters are consistent with Grade I diastolic dysfunction (impaired relaxation). Right Ventricle: The right ventricular size is normal. No increase in right ventricular wall thickness. Right ventricular systolic function is normal. Tricuspid regurgitation signal is inadequate for assessing PA pressure. Left Atrium: Left atrial size was normal in size. Right Atrium: Right atrial size was normal in size. Pericardium: A small pericardial effusion is present. The pericardial effusion is circumferential. Presence of epicardial fat layer.  Mitral Valve: The mitral valve is normal in structure. No evidence of mitral valve regurgitation. No evidence of mitral valve stenosis. MV peak gradient, 4.9 mmHg. The mean mitral valve gradient is 2.0 mmHg. Tricuspid  Valve: The tricuspid valve is normal in structure. Tricuspid valve regurgitation is not demonstrated. No evidence of tricuspid stenosis. Aortic Valve: The aortic valve was not well visualized. Aortic valve regurgitation is not visualized. Aortic valve mean gradient measures 5.0 mmHg. Aortic valve peak gradient measures 9.6 mmHg. Aortic valve area, by VTI measures 3.08 cm. Pulmonic Valve: The pulmonic valve was normal in structure. Pulmonic valve regurgitation is not visualized. No evidence of pulmonic stenosis. Aorta: The aortic root and ascending aorta are structurally normal, with no evidence of dilitation. Venous: The inferior vena cava was not well visualized. IAS/Shunts: No atrial level shunt detected by color flow Doppler.  LEFT VENTRICLE PLAX 2D LVIDd:         4.40 cm     Diastology LVIDs:         3.20 cm     LV e' medial:    7.62 cm/s LV PW:         1.30 cm     LV E/e' medial:  13.1 LV IVS:        1.30 cm     LV e' lateral:   11.50 cm/s LVOT diam:     2.00 cm     LV E/e' lateral: 8.7 LV SV:         85 LV SV Index:   40 LVOT Area:     3.14 cm  LV Volumes (MOD) LV vol d, MOD A2C: 43.1 ml LV vol d, MOD A4C: 43.5 ml LV vol s, MOD A2C: 15.2 ml LV vol s, MOD A4C: 18.9 ml LV SV MOD A2C:     27.9 ml LV SV MOD A4C:     43.5 ml LV SV MOD BP:      26.8 ml RIGHT VENTRICLE RV Basal diam:  3.30 cm RV Mid diam:    3.20 cm RV S prime:     17.50 cm/s TAPSE (M-mode): 3.3 cm LEFT ATRIUM             Index        RIGHT ATRIUM           Index LA diam:        4.50 cm 2.14 cm/m   RA Area:     18.40 cm LA Vol (A2C):   52.8 ml 25.11 ml/m  RA Volume:   53.30 ml  25.35 ml/m LA Vol (A4C):   48.2 ml 22.92 ml/m LA Biplane Vol: 50.8 ml 24.16 ml/m  AORTIC VALVE                     PULMONIC VALVE AV Area (Vmax):    2.66  cm      PV Vmax:       0.95 m/s AV Area (Vmean):   2.41 cm      PV Peak grad:  3.6 mmHg AV Area (VTI):     3.08 cm AV Vmax:           155.00 cm/s AV Vmean:          103.000 cm/s AV VTI:            0.275 m AV Peak Grad:      9.6 mmHg AV Mean Grad:      5.0 mmHg LVOT Vmax:         131.00 cm/s LVOT Vmean:        79.100 cm/s LVOT VTI:          0.270 m LVOT/AV VTI ratio:  0.98  AORTA Ao Root diam: 3.10 cm Ao Asc diam:  3.30 cm MITRAL VALVE MV Area (PHT): 5.23 cm     SHUNTS MV Area VTI:   4.18 cm     Systemic VTI:  0.27 m MV Peak grad:  4.9 mmHg     Systemic Diam: 2.00 cm MV Mean grad:  2.0 mmHg MV Vmax:       1.11 m/s MV Vmean:      60.4 cm/s MV Decel Time: 145 msec MV E velocity: 100.00 cm/s MV A velocity: 99.00 cm/s MV E/A ratio:  1.01 Rudean Haskell MD Electronically signed by Rudean Haskell MD Signature Date/Time: 07/15/2021/3:10:17 PM    Final       LOS: 1 day    Flora Lipps, MD [facility] Available via Epic secure chat 7am-7pm After these hours, please refer to coverage provider listed on [email or web address] 07/15/2021, 3:45 PM

## 2021-07-16 DIAGNOSIS — I890 Lymphedema, not elsewhere classified: Secondary | ICD-10-CM | POA: Diagnosis not present

## 2021-07-16 DIAGNOSIS — J449 Chronic obstructive pulmonary disease, unspecified: Secondary | ICD-10-CM | POA: Diagnosis not present

## 2021-07-16 DIAGNOSIS — R262 Difficulty in walking, not elsewhere classified: Secondary | ICD-10-CM | POA: Diagnosis not present

## 2021-07-16 DIAGNOSIS — R7303 Prediabetes: Secondary | ICD-10-CM | POA: Diagnosis not present

## 2021-07-16 DIAGNOSIS — L03119 Cellulitis of unspecified part of limb: Secondary | ICD-10-CM | POA: Diagnosis not present

## 2021-07-16 DIAGNOSIS — R5381 Other malaise: Secondary | ICD-10-CM | POA: Diagnosis not present

## 2021-07-16 DIAGNOSIS — R4689 Other symptoms and signs involving appearance and behavior: Secondary | ICD-10-CM | POA: Diagnosis not present

## 2021-07-16 LAB — BASIC METABOLIC PANEL
Anion gap: 9 (ref 5–15)
BUN: 9 mg/dL (ref 8–23)
CO2: 25 mmol/L (ref 22–32)
Calcium: 8.5 mg/dL — ABNORMAL LOW (ref 8.9–10.3)
Chloride: 106 mmol/L (ref 98–111)
Creatinine, Ser: 0.6 mg/dL (ref 0.44–1.00)
GFR, Estimated: >60 mL/min (ref >60)
Glucose, Bld: 109 mg/dL — ABNORMAL HIGH (ref 70–99)
Potassium: 3.7 mmol/L (ref 3.5–5.1)
Sodium: 140 mmol/L (ref 135–145)

## 2021-07-16 LAB — GLUCOSE, CAPILLARY
Glucose-Capillary: 136 mg/dL — ABNORMAL HIGH (ref 70–99)
Glucose-Capillary: 138 mg/dL — ABNORMAL HIGH (ref 70–99)
Glucose-Capillary: 124 mg/dL — ABNORMAL HIGH (ref 70–99)
Glucose-Capillary: 148 mg/dL — ABNORMAL HIGH (ref 70–99)

## 2021-07-16 LAB — URINE CULTURE: Culture: 20000 — AB

## 2021-07-16 MED ORDER — MELATONIN 3 MG PO TABS
6.0000 mg | ORAL_TABLET | Freq: Once | ORAL | Status: AC
Start: 2021-07-16 — End: 2021-07-16
  Administered 2021-07-16: 6 mg via ORAL
  Filled 2021-07-16: qty 2

## 2021-07-16 MED ORDER — DIPHENHYDRAMINE HCL 25 MG PO CAPS
25.0000 mg | ORAL_CAPSULE | Freq: Once | ORAL | Status: AC
  Administered 2021-07-16: 25 mg via ORAL
  Filled 2021-07-16: qty 1

## 2021-07-16 NOTE — Progress Notes (Signed)
PROGRESS NOTE    Margaret Weber  KPT:[id number] DOB: 08/03/1960 DOA: 07/14/2021 PCP: Patient, No Pcp Per (Inactive)    Brief Narrative:  Margaret Weber is a 61 y.o. female with past medical history of COPD lymphedema presented to hospital with bilateral lower extremity swelling and pain.  Patient significant other-who is a [employer], had called in APS due to caregiver burnout.  Of note, as per the admitting provider patient has not walked in at least 3 to 4 years due to fear of falls.  Patient has been having worsening swelling of her lower extremities since 2021.  She continues to smoke at home.  Significant other is hoping patient will be able to go to rehab or nursing home on discharge.  In the ED, patient had mild tachycardia.  WBC at 14.1.  Lactate at 1.5.  X-ray of the right foot showed soft tissue swelling.  X-ray of the chest, left foot was unremarkable.  EKG showed sinus tachycardia.  Patient was given Lasix IV vancomycin for possible cellulitis and was admitted to hospital for further evaluation and treatment.  Assessment and Plan:  Principal Problem:   Cellulitis Active Problems:   Tobacco abuse   Self neglect   Prediabetes   COPD (chronic obstructive pulmonary disease) (HCC)   Ambulatory dysfunction   Lymphedema   Debility   * Cellulitis With the background of lymphedema. Sepsis ruled out.  Received IV vancomycin.  Continue Lasix.  Blood cultures negative less than 24 hours.  Check 2D echocardiogram due to lower extremity edema.   Elevated ESR, CRP of 1.3.   COPD (chronic obstructive pulmonary disease) ([place]) Wheezing present on admission.  Ongoing tobacco abuse.  Continue supplemental oxygen, DuoNebs.  Does not tolerate steroids as per the patient    Diabetes mellitus type 2. History of prediabetes.  Hemoglobin A1c of 6.7 at this time.  Qualifies for type 2 diabetes.  Add sliding scale insulin, will consult diabetic coordinator.  Diabetic education.  Diabetic  diet.   Ambulatory dysfunction Possibility of self-neglect.  Totally dependent for activities of daily living.  Not ambulatory has not walked in 3 to 4 years.  Significant other has been patient's sole caregiver and reports caregiver burnout.  Physical therapy evaluation today recommended skilled nursing facility placement.   Tobacco abuse Smokes a pack to a pack and a half of cigarettes daily.  Continue nicotine patch    DVT prophylaxis:   Lovenox subcu   Code Status:     Code Status: Full Code  Disposition: Skilled nursing facility as per PT recommendation.  Status is: Inpatient  Remains inpatient appropriate because: Need for rehabilitation, IV vancomycin   Family Communication:  Communicated with the patient at bedside  Consultants:  None  Procedures:  None  Antimicrobials:  Vancomycin IV  Anti-infectives (From admission, onward)    Start     Dose/Rate Route Frequency Ordered Stop   07/15/21 1700  vancomycin (VANCOREADY) IVPB 1500 mg/300 mL        1,500 mg 150 mL/hr over 120 Minutes Intravenous Every 24 hours 07/14/21 1703     07/14/21 1700  vancomycin (VANCOREADY) IVPB 2000 mg/400 mL        2,000 mg 200 mL/hr over 120 Minutes Intravenous  Once 07/14/21 1646 07/14/21 2024      Subjective: Today, patient was seen and examined at bedside.  Denies any nausea vomiting fever chills or rigor.    Objective: Vitals:   07/15/21 1217 07/15/21 1918 07/15/21 1956 07/16/21 1700  BP: 119/64  (!) 132/57 117/63  Pulse: 95  92 90  Resp: _0 Temp: 98.3 F (36.8 C)  97.8 F (36.6 C) 98.1 F (36.7 C)  TempSrc: Oral  Oral   SpO2: 90% 90% 95% 91%  Weight:      Height:        Intake/Output Summary (Last 24 hours) at 07/16/2021 1856 Last data filed at 07/16/2021 0500 Gross per 24 hour  Intake 780 ml  Output 1350 ml  Net -570 ml    Filed Weights   07/14/21 1631  Weight: 107.5 kg    Physical Examination: Body mass index is 40.68 kg/m.  General: Morbidly  obese built, not in obvious distress HENT:   No scleral pallor or icterus noted. Oral mucosa is moist.  Chest:   Diminished breath sounds bilaterally. No crackles or wheezes.  CVS: S1 &S2 heard. No murmur.  Regular rate and rhythm. Abdomen: Soft, nontender, nondistended.  Bowel sounds are heard.   Extremities: Long yellow nails, bilateral lymphedema, dry skin with mild erythema bilaterally.  Peripheral pulses are palpable. Psych: Alert, awake and oriented, normal mood CNS:  No cranial nerve deficits.  Power equal in all extremities.   Skin:See extremities.     Data Reviewed:   CBC: Recent Labs  Lab 07/14/21 1450 07/15/21 0430 07/15/21 1210  WBC 14.1* 14.2* 12.7*  NEUTROABS 11.6*  --   --   HGB 14.0 13.2 13.1  HCT 46.0 42.2 40.8  MCV 98.7 97.2 96.0  PLT 265 264 251     Basic Metabolic Panel: Recent Labs  Lab 07/14/21 1450 07/15/21 0430 07/16/21 0354  NA 140 139 140  K 4.3 3.7 3.7  CL 103 104 106  CO2 _1 GLUCOSE 120* 118* 109*  BUN _2 CREATININE 0.64 0.61 0.60  CALCIUM 9.2 8.6* 8.5*     Liver Function Tests: Recent Labs  Lab 07/14/21 1450  AST 21  ALT 30  ALKPHOS 87  BILITOT 0.3  PROT 8.2*  ALBUMIN 4.1      Radiology Studies: DG Tibia/Fibula Left  Result Date: 07/14/2021 CLINICAL DATA:  Rule out osteo EXAM: LEFT TIBIA AND FIBULA - 2 VIEW COMPARISON:  None Available. FINDINGS: Negative for acute fracture. Soft tissue calcification lateral to the stuffy via likely due to chronic injury. No osteomyelitis. IMPRESSION: No acute skeletal abnormality. Electronically Signed   By: Franchot Gallo M.D.   On: 07/14/2021 15:26   DG Tibia/Fibula Right  Result Date: 07/14/2021 CLINICAL DATA:  Rule out osteomyelitis EXAM: RIGHT TIBIA AND FIBULA - 2 VIEW COMPARISON:  None Available. FINDINGS: Negative for fracture or osteomyelitis. Diffuse soft tissue swelling. IMPRESSION: No acute skeletal abnormality.  Soft tissue swelling Electronically Signed   By:  Franchot Gallo M.D.   On: 07/14/2021 15:26   DG Chest Port 1 View  Result Date: 07/14/2021 CLINICAL DATA:  Question sepsis EXAM: PORTABLE CHEST 1 VIEW COMPARISON:  01/28/2020 FINDINGS: The heart size and mediastinal contours are within normal limits. Both lungs are clear. The visualized skeletal structures are unremarkable. IMPRESSION: No active disease. Electronically Signed   By: Franchot Gallo M.D.   On: 07/14/2021 15:23   DG Foot Complete Left  Result Date: 07/14/2021 CLINICAL DATA:  Question sepsis.  Rule out osteo EXAM: LEFT FOOT - COMPLETE 3+ VIEW COMPARISON:  None Available. FINDINGS: There is no evidence of fracture or dislocation. There is no evidence of arthropathy or other focal bone abnormality. Soft tissues  are unremarkable. IMPRESSION: Negative. Electronically Signed   By: Franchot Gallo M.D.   On: 07/14/2021 15:24   DG Foot Complete Right  Result Date: 07/14/2021 CLINICAL DATA:  Rule out osteo EXAM: RIGHT FOOT COMPLETE - 3+ VIEW COMPARISON:  None Available. FINDINGS: Negative for fracture.  No evidence of osteomyelitis Prominent soft tissue swelling on the dorsum of the foot overlying the metatarsals. No foreign body. IMPRESSION: Negative for osteomyelitis Prominent soft tissue swelling dorsal foot. Possible hematoma or infection. Electronically Signed   By: Franchot Gallo M.D.   On: 07/14/2021 15:25   ECHOCARDIOGRAM COMPLETE  Result Date: 07/15/2021    ECHOCARDIOGRAM REPORT   Patient Name:   Margaret Weber Date of Exam: 07/15/2021 Medical Rec #:  [id number]       Height:       64.0 in Accession #:    [id number]      Weight:       237.0 lb Date of Birth:  April 30, 1960       BSA:          2.103 m Patient Age:    28 years        BP:           105/60 mmHg Patient Gender: F               HR:           96 bpm. Exam Location:  [facility] Procedure: 2D Echo, Cardiac Doppler and Color Doppler Indications:    Bilateral lower extremity edema  History:        Patient has prior history of  Echocardiogram examinations, most                 recent 01/30/2020. COPD; Risk Factors:Current Smoker.  Sonographer:    Wenda Low Referring Phys: [phone] Leanne Chang [facility]  Sonographer Comments: Patient is morbidly obese. IMPRESSIONS  1. Left ventricular ejection fraction, by estimation, is 60 to 65%. The left ventricle has normal function. The left ventricle has no regional wall motion abnormalities. There is moderate concentric left ventricular hypertrophy. Left ventricular diastolic parameters are consistent with Grade I diastolic dysfunction (impaired relaxation).  2. Right ventricular systolic function is normal. The right ventricular size is normal. Tricuspid regurgitation signal is inadequate for assessing PA pressure.  3. A small pericardial effusion is present. The pericardial effusion is circumferential.  4. The mitral valve is normal in structure. No evidence of mitral valve regurgitation. No evidence of mitral stenosis.  5. The aortic valve was not well visualized. Aortic valve regurgitation is not visualized. Comparison(s): No significant change from prior study. Prior images reviewed side by side. FINDINGS  Left Ventricle: Left ventricular ejection fraction, by estimation, is 60 to 65%. The left ventricle has normal function. The left ventricle has no regional wall motion abnormalities. The left ventricular internal cavity size was normal in size. There is  moderate concentric left ventricular hypertrophy. Left ventricular diastolic parameters are consistent with Grade I diastolic dysfunction (impaired relaxation). Right Ventricle: The right ventricular size is normal. No increase in right ventricular wall thickness. Right ventricular systolic function is normal. Tricuspid regurgitation signal is inadequate for assessing PA pressure. Left Atrium: Left atrial size was normal in size. Right Atrium: Right atrial size was normal in size. Pericardium: A small pericardial effusion is present. The  pericardial effusion is circumferential. Presence of epicardial fat layer. Mitral Valve: The mitral valve is normal in structure. No evidence of mitral valve regurgitation. No evidence of  mitral valve stenosis. MV peak gradient, 4.9 mmHg. The mean mitral valve gradient is 2.0 mmHg. Tricuspid Valve: The tricuspid valve is normal in structure. Tricuspid valve regurgitation is not demonstrated. No evidence of tricuspid stenosis. Aortic Valve: The aortic valve was not well visualized. Aortic valve regurgitation is not visualized. Aortic valve mean gradient measures 5.0 mmHg. Aortic valve peak gradient measures 9.6 mmHg. Aortic valve area, by VTI measures 3.08 cm. Pulmonic Valve: The pulmonic valve was normal in structure. Pulmonic valve regurgitation is not visualized. No evidence of pulmonic stenosis. Aorta: The aortic root and ascending aorta are structurally normal, with no evidence of dilitation. Venous: The inferior vena cava was not well visualized. IAS/Shunts: No atrial level shunt detected by color flow Doppler.  LEFT VENTRICLE PLAX 2D LVIDd:         4.40 cm     Diastology LVIDs:         3.20 cm     LV e' medial:    7.62 cm/s LV PW:         1.30 cm     LV E/e' medial:  13.1 LV IVS:        1.30 cm     LV e' lateral:   11.50 cm/s LVOT diam:     2.00 cm     LV E/e' lateral: 8.7 LV SV:         85 LV SV Index:   40 LVOT Area:     3.14 cm  LV Volumes (MOD) LV vol d, MOD A2C: 43.1 ml LV vol d, MOD A4C: 43.5 ml LV vol s, MOD A2C: 15.2 ml LV vol s, MOD A4C: 18.9 ml LV SV MOD A2C:     27.9 ml LV SV MOD A4C:     43.5 ml LV SV MOD BP:      26.8 ml RIGHT VENTRICLE RV Basal diam:  3.30 cm RV Mid diam:    3.20 cm RV S prime:     17.50 cm/s TAPSE (M-mode): 3.3 cm LEFT ATRIUM             Index        RIGHT ATRIUM           Index LA diam:        4.50 cm 2.14 cm/m   RA Area:     18.40 cm LA Vol (A2C):   52.8 ml 25.11 ml/m  RA Volume:   53.30 ml  25.35 ml/m LA Vol (A4C):   48.2 ml 22.92 ml/m LA Biplane Vol: 50.8 ml 24.16  ml/m  AORTIC VALVE                     PULMONIC VALVE AV Area (Vmax):    2.66 cm      PV Vmax:       0.95 m/s AV Area (Vmean):   2.41 cm      PV Peak grad:  3.6 mmHg AV Area (VTI):     3.08 cm AV Vmax:           155.00 cm/s AV Vmean:          103.000 cm/s AV VTI:            0.275 m AV Peak Grad:      9.6 mmHg AV Mean Grad:      5.0 mmHg LVOT Vmax:         131.00 cm/s LVOT Vmean:        79.100  cm/s LVOT VTI:          0.270 m LVOT/AV VTI ratio: 0.98  AORTA Ao Root diam: 3.10 cm Ao Asc diam:  3.30 cm MITRAL VALVE MV Area (PHT): 5.23 cm     SHUNTS MV Area VTI:   4.18 cm     Systemic VTI:  0.27 m MV Peak grad:  4.9 mmHg     Systemic Diam: 2.00 cm MV Mean grad:  2.0 mmHg MV Vmax:       1.11 m/s MV Vmean:      60.4 cm/s MV Decel Time: 145 msec MV E velocity: 100.00 cm/s MV A velocity: 99.00 cm/s MV E/A ratio:  1.01 Rudean Haskell MD Electronically signed by Rudean Haskell MD Signature Date/Time: 07/15/2021/3:10:17 PM    Final       LOS: 2 days    Flora Lipps, MD [facility] Available via Epic secure chat 7am-7pm After these hours, please refer to coverage provider listed on [email or web address] 07/16/2021, 9:22 AM

## 2021-07-16 NOTE — Progress Notes (Signed)
Pharmacy Antibiotic Note  Margaret Weber is a 61 y.o. female admitted on 07/14/2021 with cellulitis.  Pharmacy has been consulted for Vancomycin dosing.  Plan: Continue Vancomycin 1500 mg IV Q 24 hrs. Goal AUC 400-550.  Expected AUC: 492  Height: 5\' 4"  (162.6 cm) Weight: 107.5 kg (237 lb) IBW/kg (Calculated) : 54.7  Temp (24hrs), Avg:98.1 F (36.7 C), Min:97.8 F (36.6 C), Max:98.3 F (36.8 C)  Recent Labs  Lab 07/14/21 1450 07/15/21 0430 07/15/21 1210 07/16/21 0354  WBC 14.1* 14.2* 12.7*  --   CREATININE 0.64 0.61  --  0.60  LATICACIDVEN 1.5  --   --   --      Estimated Creatinine Clearance: 87.2 mL/min (by C-G formula based on SCr of 0.6 mg/dL).    No Known Allergies  Antimicrobials this admission: vancomycin 5/18 >>   Microbiology results: 5/18 BCx: pending 5/18 UCx: pending   Thank you for allowing pharmacy to be a part of this patient's care.  6/18, PharmD Clinical Pharmacist 07/16/2021 9:43 AM

## 2021-07-17 DIAGNOSIS — R7303 Prediabetes: Secondary | ICD-10-CM | POA: Diagnosis not present

## 2021-07-17 DIAGNOSIS — L03119 Cellulitis of unspecified part of limb: Secondary | ICD-10-CM | POA: Diagnosis not present

## 2021-07-17 DIAGNOSIS — J449 Chronic obstructive pulmonary disease, unspecified: Secondary | ICD-10-CM | POA: Diagnosis not present

## 2021-07-17 DIAGNOSIS — I890 Lymphedema, not elsewhere classified: Secondary | ICD-10-CM

## 2021-07-17 DIAGNOSIS — R262 Difficulty in walking, not elsewhere classified: Secondary | ICD-10-CM | POA: Diagnosis not present

## 2021-07-17 DIAGNOSIS — R4689 Other symptoms and signs involving appearance and behavior: Secondary | ICD-10-CM | POA: Diagnosis not present

## 2021-07-17 DIAGNOSIS — R5381 Other malaise: Secondary | ICD-10-CM

## 2021-07-17 LAB — CBC
HCT: 42.5 % (ref 36.0–46.0)
Hemoglobin: 13.2 g/dL (ref 12.0–15.0)
MCH: 30 pg (ref 26.0–34.0)
MCHC: 31.1 g/dL (ref 30.0–36.0)
MCV: 96.6 fL (ref 80.0–100.0)
Platelets: 242 10*3/uL (ref 150–400)
RBC: 4.4 MIL/uL (ref 3.87–5.11)
RDW: 14.2 % (ref 11.5–15.5)
WBC: 10.3 10*3/uL (ref 4.0–10.5)
nRBC: 0 % (ref 0.0–0.2)

## 2021-07-17 LAB — GLUCOSE, CAPILLARY
Glucose-Capillary: 132 mg/dL — ABNORMAL HIGH (ref 70–99)
Glucose-Capillary: 136 mg/dL — ABNORMAL HIGH (ref 70–99)
Glucose-Capillary: 120 mg/dL — ABNORMAL HIGH (ref 70–99)
Glucose-Capillary: 129 mg/dL — ABNORMAL HIGH (ref 70–99)

## 2021-07-17 MED ORDER — MELATONIN 3 MG PO TABS
6.0000 mg | ORAL_TABLET | Freq: Every evening | ORAL | Status: DC | PRN
  Administered 2021-07-17 – 2021-07-21 (×2): 6 mg via ORAL
  Filled 2021-07-17 (×2): qty 2

## 2021-07-17 MED ORDER — CEPHALEXIN 500 MG PO CAPS
500.0000 mg | ORAL_CAPSULE | Freq: Four times a day (QID) | ORAL | Status: AC
  Administered 2021-07-17 – 2021-07-21 (×19): 500 mg via ORAL
  Filled 2021-07-17 (×19): qty 1

## 2021-07-17 NOTE — Progress Notes (Signed)
PROGRESS NOTE    Margaret Weber  PIR:[id number] DOB: 1960-08-17 DOA: 07/14/2021 PCP: Patient, No Pcp Per (Inactive)    Brief Narrative:  Margaret Weber is a 61 y.o. female with past medical history of COPD lymphedema presented to hospital with bilateral lower extremity swelling and pain.  Patient significant other-who is a [employer], had called in APS due to caregiver burnout.  Of note, as per the admitting provider patient has not walked in at least 3 to 4 years due to fear of falls.  Patient has been having worsening swelling of her lower extremities since 2021.  She continues to smoke at home.  Significant other is hoping patient will be able to go to rehab or nursing home on discharge.  In the ED, patient had mild tachycardia.  WBC at 14.1.  Lactate at 1.5.  X-ray of the right foot showed soft tissue swelling.  X-ray of the chest, left foot was unremarkable.  EKG showed sinus tachycardia.  Patient was given Lasix IV vancomycin for possible cellulitis and was admitted to hospital for further evaluation and treatment.  Assessment and Plan:  Principal Problem:   Cellulitis Active Problems:   Tobacco abuse   Self neglect   Prediabetes   COPD (chronic obstructive pulmonary disease) (HCC)   Ambulatory dysfunction   Lymphedema   Debility   * Cellulitis With the background of lymphedema. Sepsis ruled out.  We will discontinue IV vancomycin.  Switch to oral Keflex.  Continue IV Lasix.  Blood cultures negative in 2 days. 2D echocardiogram with preserved LV function with grade 1 diastolic dysfunction.  Patient with lower extremity lymphedema, edema.    Elevated ESR, CRP of 1.3.   COPD (chronic obstructive pulmonary disease) ([place]) Wheezing present on admission.  Ongoing tobacco abuse.  Continue supplemental oxygen, DuoNebs.  Does not tolerate steroids as per the patient.  Overall stable at this time.   Diabetes mellitus type 2. New diagnosis.  Hemoglobin A1c of 6.7 at this time.   diabetic coordinator on board.  Will consider metformin on discharge.  Diabetic education.  Diabetic diet.   Ambulatory dysfunction Possibility of self-neglect.  Totally dependent for activities of daily living.  Not ambulatory has not walked in 3 to 4 years.  Significant other has been patient's sole caregiver and reports caregiver burnout.  Physical therapy evaluation evaluation recommends  skilled nursing facility placement.   Tobacco abuse Smokes a pack to a pack and a half of cigarettes daily.  Continue nicotine patch  nicotine gums    DVT prophylaxis:   Lovenox subcu   Code Status:     Code Status: Full Code  Disposition: Skilled nursing facility as per PT recommendation.  Status is: Inpatient  Remains inpatient appropriate because: Need for rehabilitation,   Family Communication:  Communicated with the patient at bedside  Consultants:  None  Procedures:  None  Antimicrobials:  Keflex p.o. 5/21>  Anti-infectives (From admission, onward)    Start     Dose/Rate Route Frequency Ordered Stop   07/17/21 0815  cephALEXin (KEFLEX) capsule 500 mg        500 mg Oral Every 6 hours 07/17/21 0715     07/15/21 1700  vancomycin (VANCOREADY) IVPB 1500 mg/300 mL  Status:  Discontinued        1,500 mg 150 mL/hr over 120 Minutes Intravenous Every 24 hours 07/14/21 1703 07/17/21 0715   07/14/21 1700  vancomycin (VANCOREADY) IVPB 2000 mg/400 mL        2,000 mg  200 mL/hr over 120 Minutes Intravenous  Once 07/14/21 1646 07/14/21 2024      Subjective: Today, patient was seen and examined at bedside.  Patient wishes to have sugar in her coffee.  Spoke about the diabetic diet.  Has not had a bowel movement but feels like she will be going to the bathroom soon.  Objective: Vitals:   07/16/21 1448 07/16/21 1920 07/16/21 2125 07/17/21 0455  BP: 135/75  125/75 129/72  Pulse: 93 (!) 102 (!) 103 85  Resp: _0 Temp: 98.3 F (36.8 C)  98.3 F (36.8 C) 97.9 F (36.6 C)   TempSrc:   Oral   SpO2: 93% 90% 93% 94%  Weight:      Height:        Intake/Output Summary (Last 24 hours) at 07/17/2021 0812 Last data filed at 07/17/2021 0700 Gross per 24 hour  Intake 1920 ml  Output 3700 ml  Net -1780 ml   Filed Weights   07/14/21 1631  Weight: 107.5 kg    Physical Examination: Body mass index is 40.68 kg/m.   General: Morbidly obese built, not in obvious distress HENT:   No scleral pallor or icterus noted. Oral mucosa is moist.  Chest:   Diminished breath sounds bilaterally. No crackles or wheezes.  CVS: S1 &S2 heard. No murmur.  Regular rate and rhythm. Abdomen: Soft, nontender, nondistended.  Bowel sounds are heard.   Extremities: Bilateral lower extremities with lymphedema mild erythema long nails dry skin Psych: Alert, awake and oriented, normal mood CNS:  No cranial nerve deficits.  Power equal in all extremities.   Skin: Warm and dry.  See extremities.     Data Reviewed:   CBC: Recent Labs  Lab 07/14/21 1450 07/15/21 0430 07/15/21 1210  WBC 14.1* 14.2* 12.7*  NEUTROABS 11.6*  --   --   HGB 14.0 13.2 13.1  HCT 46.0 42.2 40.8  MCV 98.7 97.2 96.0  PLT 265 264 017    Basic Metabolic Panel: Recent Labs  Lab 07/14/21 1450 07/15/21 0430 07/16/21 0354  NA 140 139 140  K 4.3 3.7 3.7  CL 103 104 106  CO2 _1 GLUCOSE 120* 118* 109*  BUN _2 CREATININE 0.64 0.61 0.60  CALCIUM 9.2 8.6* 8.5*    Liver Function Tests: Recent Labs  Lab 07/14/21 1450  AST 21  ALT 30  ALKPHOS 87  BILITOT 0.3  PROT 8.2*  ALBUMIN 4.1     Radiology Studies: ECHOCARDIOGRAM COMPLETE  Result Date: 07/15/2021    ECHOCARDIOGRAM REPORT   Patient Name:   Margaret Weber Date of Exam: 07/15/2021 Medical Rec #:  [id number]       Height:       64.0 in Accession #:    [id number]      Weight:       237.0 lb Date of Birth:  1960/07/17       BSA:          2.103 m Patient Age:    61 years        BP:           105/60 mmHg Patient Gender: F                HR:           96 bpm. Exam Location:  [facility] Procedure: 2D Echo, Cardiac Doppler and Color Doppler Indications:    Bilateral lower extremity edema  History:  Patient has prior history of Echocardiogram examinations, most                 recent 01/30/2020. COPD; Risk Factors:Current Smoker.  Sonographer:    Wenda Low Referring Phys: [phone] Leanne Chang [facility]  Sonographer Comments: Patient is morbidly obese. IMPRESSIONS  1. Left ventricular ejection fraction, by estimation, is 60 to 65%. The left ventricle has normal function. The left ventricle has no regional wall motion abnormalities. There is moderate concentric left ventricular hypertrophy. Left ventricular diastolic parameters are consistent with Grade I diastolic dysfunction (impaired relaxation).  2. Right ventricular systolic function is normal. The right ventricular size is normal. Tricuspid regurgitation signal is inadequate for assessing PA pressure.  3. A small pericardial effusion is present. The pericardial effusion is circumferential.  4. The mitral valve is normal in structure. No evidence of mitral valve regurgitation. No evidence of mitral stenosis.  5. The aortic valve was not well visualized. Aortic valve regurgitation is not visualized. Comparison(s): No significant change from prior study. Prior images reviewed side by side. FINDINGS  Left Ventricle: Left ventricular ejection fraction, by estimation, is 60 to 65%. The left ventricle has normal function. The left ventricle has no regional wall motion abnormalities. The left ventricular internal cavity size was normal in size. There is  moderate concentric left ventricular hypertrophy. Left ventricular diastolic parameters are consistent with Grade I diastolic dysfunction (impaired relaxation). Right Ventricle: The right ventricular size is normal. No increase in right ventricular wall thickness. Right ventricular systolic function is normal. Tricuspid regurgitation signal is  inadequate for assessing PA pressure. Left Atrium: Left atrial size was normal in size. Right Atrium: Right atrial size was normal in size. Pericardium: A small pericardial effusion is present. The pericardial effusion is circumferential. Presence of epicardial fat layer. Mitral Valve: The mitral valve is normal in structure. No evidence of mitral valve regurgitation. No evidence of mitral valve stenosis. MV peak gradient, 4.9 mmHg. The mean mitral valve gradient is 2.0 mmHg. Tricuspid Valve: The tricuspid valve is normal in structure. Tricuspid valve regurgitation is not demonstrated. No evidence of tricuspid stenosis. Aortic Valve: The aortic valve was not well visualized. Aortic valve regurgitation is not visualized. Aortic valve mean gradient measures 5.0 mmHg. Aortic valve peak gradient measures 9.6 mmHg. Aortic valve area, by VTI measures 3.08 cm. Pulmonic Valve: The pulmonic valve was normal in structure. Pulmonic valve regurgitation is not visualized. No evidence of pulmonic stenosis. Aorta: The aortic root and ascending aorta are structurally normal, with no evidence of dilitation. Venous: The inferior vena cava was not well visualized. IAS/Shunts: No atrial level shunt detected by color flow Doppler.  LEFT VENTRICLE PLAX 2D LVIDd:         4.40 cm     Diastology LVIDs:         3.20 cm     LV e' medial:    7.62 cm/s LV PW:         1.30 cm     LV E/e' medial:  13.1 LV IVS:        1.30 cm     LV e' lateral:   11.50 cm/s LVOT diam:     2.00 cm     LV E/e' lateral: 8.7 LV SV:         85 LV SV Index:   40 LVOT Area:     3.14 cm  LV Volumes (MOD) LV vol d, MOD A2C: 43.1 ml LV vol d, MOD A4C: 43.5 ml LV  vol s, MOD A2C: 15.2 ml LV vol s, MOD A4C: 18.9 ml LV SV MOD A2C:     27.9 ml LV SV MOD A4C:     43.5 ml LV SV MOD BP:      26.8 ml RIGHT VENTRICLE RV Basal diam:  3.30 cm RV Mid diam:    3.20 cm RV S prime:     17.50 cm/s TAPSE (M-mode): 3.3 cm LEFT ATRIUM             Index        RIGHT ATRIUM           Index LA  diam:        4.50 cm 2.14 cm/m   RA Area:     18.40 cm LA Vol (A2C):   52.8 ml 25.11 ml/m  RA Volume:   53.30 ml  25.35 ml/m LA Vol (A4C):   48.2 ml 22.92 ml/m LA Biplane Vol: 50.8 ml 24.16 ml/m  AORTIC VALVE                     PULMONIC VALVE AV Area (Vmax):    2.66 cm      PV Vmax:       0.95 m/s AV Area (Vmean):   2.41 cm      PV Peak grad:  3.6 mmHg AV Area (VTI):     3.08 cm AV Vmax:           155.00 cm/s AV Vmean:          103.000 cm/s AV VTI:            0.275 m AV Peak Grad:      9.6 mmHg AV Mean Grad:      5.0 mmHg LVOT Vmax:         131.00 cm/s LVOT Vmean:        79.100 cm/s LVOT VTI:          0.270 m LVOT/AV VTI ratio: 0.98  AORTA Ao Root diam: 3.10 cm Ao Asc diam:  3.30 cm MITRAL VALVE MV Area (PHT): 5.23 cm     SHUNTS MV Area VTI:   4.18 cm     Systemic VTI:  0.27 m MV Peak grad:  4.9 mmHg     Systemic Diam: 2.00 cm MV Mean grad:  2.0 mmHg MV Vmax:       1.11 m/s MV Vmean:      60.4 cm/s MV Decel Time: 145 msec MV E velocity: 100.00 cm/s MV A velocity: 99.00 cm/s MV E/A ratio:  1.01 Rudean Haskell MD Electronically signed by Rudean Haskell MD Signature Date/Time: 07/15/2021/3:10:17 PM    Final       LOS: 3 days    Flora Lipps, MD [facility] Available via Epic secure chat 7am-7pm After these hours, please refer to coverage provider listed on [email or web address] 07/17/2021, 8:12 AM

## 2021-07-17 NOTE — Progress Notes (Signed)
There are no bed offers available for patient at this time.  Edwin Dada, MSW, LCSW Transitions of Care  Clinical Social Worker II [phone]

## 2021-07-18 DIAGNOSIS — R262 Difficulty in walking, not elsewhere classified: Secondary | ICD-10-CM | POA: Diagnosis not present

## 2021-07-18 DIAGNOSIS — I890 Lymphedema, not elsewhere classified: Secondary | ICD-10-CM | POA: Diagnosis not present

## 2021-07-18 DIAGNOSIS — R7303 Prediabetes: Secondary | ICD-10-CM | POA: Diagnosis not present

## 2021-07-18 DIAGNOSIS — L03119 Cellulitis of unspecified part of limb: Secondary | ICD-10-CM | POA: Diagnosis not present

## 2021-07-18 DIAGNOSIS — R4689 Other symptoms and signs involving appearance and behavior: Secondary | ICD-10-CM | POA: Diagnosis not present

## 2021-07-18 DIAGNOSIS — R5381 Other malaise: Secondary | ICD-10-CM | POA: Diagnosis not present

## 2021-07-18 DIAGNOSIS — J449 Chronic obstructive pulmonary disease, unspecified: Secondary | ICD-10-CM | POA: Diagnosis not present

## 2021-07-18 LAB — BASIC METABOLIC PANEL
Anion gap: 8 (ref 5–15)
BUN: 12 mg/dL (ref 8–23)
CO2: 25 mmol/L (ref 22–32)
Calcium: 8.8 mg/dL — ABNORMAL LOW (ref 8.9–10.3)
Chloride: 105 mmol/L (ref 98–111)
Creatinine, Ser: 0.68 mg/dL (ref 0.44–1.00)
GFR, Estimated: >60 mL/min (ref >60)
Glucose, Bld: 119 mg/dL — ABNORMAL HIGH (ref 70–99)
Potassium: 3.6 mmol/L (ref 3.5–5.1)
Sodium: 138 mmol/L (ref 135–145)

## 2021-07-18 LAB — GLUCOSE, CAPILLARY
Glucose-Capillary: 131 mg/dL — ABNORMAL HIGH (ref 70–99)
Glucose-Capillary: 163 mg/dL — ABNORMAL HIGH (ref 70–99)
Glucose-Capillary: 126 mg/dL — ABNORMAL HIGH (ref 70–99)
Glucose-Capillary: 134 mg/dL — ABNORMAL HIGH (ref 70–99)

## 2021-07-18 MED ORDER — TORSEMIDE 20 MG PO TABS
20.0000 mg | ORAL_TABLET | Freq: Every day | ORAL | Status: DC
Start: 2021-07-18 — End: 2021-07-22
  Administered 2021-07-18 – 2021-07-22 (×5): 20 mg via ORAL
  Filled 2021-07-18 (×5): qty 1

## 2021-07-18 MED ORDER — BISACODYL 10 MG RE SUPP: 10.0000 mg | Freq: Every day | RECTAL | Status: DC

## 2021-07-18 MED ORDER — DOCUSATE SODIUM 100 MG PO CAPS
100.0000 mg | ORAL_CAPSULE | Freq: Two times a day (BID) | ORAL | Status: DC
  Administered 2021-07-18 – 2021-07-20 (×5): 100 mg via ORAL
  Filled 2021-07-18 (×5): qty 1

## 2021-07-18 MED ORDER — POLYETHYLENE GLYCOL 3350 17 G PO PACK
17.0000 g | PACK | Freq: Two times a day (BID) | ORAL | Status: DC
  Administered 2021-07-18 – 2021-07-22 (×6): 17 g via ORAL
  Filled 2021-07-18 (×7): qty 1

## 2021-07-18 MED ORDER — BISACODYL 10 MG RE SUPP
10.0000 mg | Freq: Every day | RECTAL | Status: DC
  Administered 2021-07-18: 10 mg via RECTAL
  Filled 2021-07-18 (×2): qty 1

## 2021-07-18 MED ORDER — ALBUTEROL SULFATE (2.5 MG/3ML) 0.083% IN NEBU: 3.0000 mL | INHALATION_SOLUTION | RESPIRATORY_TRACT | Status: DC | PRN

## 2021-07-18 MED ORDER — DIPHENHYDRAMINE HCL 25 MG PO CAPS
25.0000 mg | ORAL_CAPSULE | Freq: Once | ORAL | Status: AC
  Administered 2021-07-18: 25 mg via ORAL
  Filled 2021-07-18: qty 1

## 2021-07-18 NOTE — Progress Notes (Addendum)
PROGRESS NOTE    Margaret Weber  JIR:[id number] DOB: [birth year] DOA: 07/14/2021 PCP: Patient, No Pcp Per (Inactive)    Brief Narrative:  Margaret Weber is a 61 y.o. female with past medical history of COPD lymphedema presented to hospital with bilateral lower extremity swelling and pain.  Patient significant other-who is a [employer], had called in APS due to caregiver burnout.  Of note, as per the admitting provider patient has not walked in at least 3 to 4 years due to fear of falls.  Patient has been having worsening swelling of her lower extremities since 2021.  She continues to smoke at home.  Significant other is hoping patient will be able to go to rehab or nursing home on discharge.  In the ED, patient had mild tachycardia.  WBC at 14.1.  Lactate at 1.5.  X-ray of the right foot showed soft tissue swelling.  X-ray of the chest, left foot was unremarkable.  EKG showed sinus tachycardia.  Patient was given Lasix,  IV vancomycin for possible cellulitis and was admitted to hospital for further evaluation and treatment.  Assessment and Plan:  Principal Problem:   Cellulitis Active Problems:   Tobacco abuse   Self neglect   Prediabetes   COPD (chronic obstructive pulmonary disease) (HCC)   Ambulatory dysfunction   Lymphedema   Debility   * Cellulitis With the background of lymphedema. Sepsis ruled out.  On oral Keflex.  Blood cultures negative in 3 days. 2D echocardiogram with preserved LV function with grade 1 diastolic dysfunction.  Patient with lower extremity lymphedema, edema.  Improving.   Elevated ESR, CRP of 1.3.  He received IV Lasix during hospitalization.  Will change to oral torsemide daily   COPD (chronic obstructive pulmonary disease) (HCC) Stable at this time.  Continue inhalers    Diabetes mellitus type 2. New diagnosis.  Hemoglobin A1c of 6.7.  diabetic coordinator on board.  Will consider metformin on discharge.  Continue diabetic education diabetic diet.    Ambulatory dysfunction Possibility of self-neglect.  Totally dependent for activities of daily living.  Not ambulatory has not walked in 3 to 4 years.  Significant other has been patient's sole caregiver and reported caregiver burnout.  Physical therapy evaluation evaluation recommends  skilled nursing facility placement.   Tobacco abuse Smokes a pack to a pack and a half of cigarettes daily.  Continue nicotine patch, nicotine gums  Constipation. Has not had a bowel movement in several days.  Continue MiraLAX we will change to twice daily dosing.  Continue Colace.  We will add Dulcolax suppository today.    DVT prophylaxis:   Lovenox subcu  Code Status:     Code Status: Full Code  Disposition: Skilled nursing facility as per PT recommendation.  Status is: Inpatient  Remains inpatient appropriate because: Need for rehabilitation,   Family Communication:  Communicated with the patient at bedside.  I also spoke with the patient's significant other on the phone and updated him about the clinical condition of the patient.  He reinforced that patient has not been doing anything for the last several years and he is burned out at home taking care of her..  Consultants:  None  Procedures:  None  Antimicrobials:  Keflex p.o. 5/21>  Anti-infectives (From admission, onward)    Start     Dose/Rate Route Frequency Ordered Stop   07/17/21 0815  cephALEXin (KEFLEX) capsule 500 mg        500 mg Oral Every 6 hours 07/17/21 0715  07/15/21 1700  vancomycin (VANCOREADY) IVPB 1500 mg/300 mL  Status:  Discontinued        1,500 mg 150 mL/hr over 120 Minutes Intravenous Every 24 hours 07/14/21 1703 07/17/21 0715   07/14/21 1700  vancomycin (VANCOREADY) IVPB 2000 mg/400 mL        2,000 mg 200 mL/hr over 120 Minutes Intravenous  Once 07/14/21 1646 07/14/21 2024      Subjective: Today, patient was seen and examined at bedside.  Denies any nausea vomiting abdominal pain but has had  constipation for several days.  Denies leg pain fever chills or rigors.  Objective: Vitals:   07/17/21 1438 07/17/21 1956 07/17/21 2008 07/18/21 0426  BP: 138/77 (!) 141/84  (!) 113/58  Pulse: 92 87  88  Resp: 18     Temp: 97.8 F (36.6 C) 97.8 F (36.6 C)    TempSrc:      SpO2: 90% 93% 96% 93%  Weight:      Height:        Intake/Output Summary (Last 24 hours) at 07/18/2021 1004 Last data filed at 07/18/2021 0959 Gross per 24 hour  Intake 960 ml  Output 1900 ml  Net -940 ml   Filed Weights   07/14/21 1631  Weight: 107.5 kg    Physical Examination: Body mass index is 40.68 kg/m.   General: Morbidly obese built, not in obvious distress HENT:   No scleral pallor or icterus noted. Oral mucosa is moist.  Chest:   Diminished breath sounds bilaterally. No crackles or wheezes.  CVS: S1 &S2 heard. No murmur.  Regular rate and rhythm. Abdomen: Soft, nontender, nondistended.  Bowel sounds are heard.   Extremities: Bilateral lower extremity mild lymphedema with erythema and long nails dry skin improved compared to presentation but  psych: Alert, awake and oriented, normal mood CNS:  No cranial nerve deficits.  Power equal in all extremities.   Skin: Warm and dry.  See extremities.     Data Reviewed:   CBC: Recent Labs  Lab 07/14/21 1450 07/15/21 0430 07/15/21 1210 07/17/21 1046  WBC 14.1* 14.2* 12.7* 10.3  NEUTROABS 11.6*  --   --   --   HGB 14.0 13.2 13.1 13.2  HCT 46.0 42.2 40.8 42.5  MCV 98.7 97.2 96.0 96.6  PLT 265 264 251 374    Basic Metabolic Panel: Recent Labs  Lab 07/14/21 1450 07/15/21 0430 07/16/21 0354 07/18/21 0430  NA 140 139 140 138  K 4.3 3.7 3.7 3.6  CL 103 104 106 105  CO2 28 26 25 25   GLUCOSE 120* 118* 109* 119*  BUN 11 10 9 12   CREATININE 0.64 0.61 0.60 0.68  CALCIUM 9.2 8.6* 8.5* 8.8*    Liver Function Tests: Recent Labs  Lab 07/14/21 1450  AST 21  ALT 30  ALKPHOS 87  BILITOT 0.3  PROT 8.2*  ALBUMIN 4.1     Radiology  Studies: No results found.    LOS: 4 days    Flora Lipps, MD [facility] Available via Epic secure chat 7am-7pm After these hours, please refer to coverage provider listed on [email or web address] 07/18/2021, 10:04 AM

## 2021-07-18 NOTE — TOC Progression Note (Signed)
Transition of Care [facility]) - Progression Note    Patient Details  Name: Jezebel Pollet MRN: [id number] Date of Birth: 1960-12-22  Transition of Care [facility]) CM/SW Contact  Annice Needy, LCSW Phone Number: 07/18/2021, 3:48 PM  Clinical Narrative:    Patient agreeable to send referral to larger area. Referral sent to additional facilities in [facility].    Expected Discharge Plan: Skilled Nursing Facility Barriers to Discharge: Continued Medical Work up  Expected Discharge Plan and Services Expected Discharge Plan: Skilled Nursing Facility In-house Referral: Clinical Social Work   Post Acute Care Choice: Skilled Nursing Facility Living arrangements for the past 2 months: Single Family Home                                       Social Determinants of Health (SDOH) Interventions    Readmission Risk Interventions     View : No data to display.

## 2021-07-19 DIAGNOSIS — R7303 Prediabetes: Secondary | ICD-10-CM | POA: Diagnosis not present

## 2021-07-19 DIAGNOSIS — R5381 Other malaise: Secondary | ICD-10-CM | POA: Diagnosis not present

## 2021-07-19 DIAGNOSIS — I890 Lymphedema, not elsewhere classified: Secondary | ICD-10-CM | POA: Diagnosis not present

## 2021-07-19 DIAGNOSIS — R262 Difficulty in walking, not elsewhere classified: Secondary | ICD-10-CM | POA: Diagnosis not present

## 2021-07-19 DIAGNOSIS — R4689 Other symptoms and signs involving appearance and behavior: Secondary | ICD-10-CM | POA: Diagnosis not present

## 2021-07-19 DIAGNOSIS — J449 Chronic obstructive pulmonary disease, unspecified: Secondary | ICD-10-CM | POA: Diagnosis not present

## 2021-07-19 DIAGNOSIS — L03119 Cellulitis of unspecified part of limb: Secondary | ICD-10-CM | POA: Diagnosis not present

## 2021-07-19 LAB — CULTURE, BLOOD (ROUTINE X 2)
Culture: NO GROWTH
Culture: NO GROWTH
Special Requests: ADEQUATE

## 2021-07-19 LAB — GLUCOSE, CAPILLARY
Glucose-Capillary: 158 mg/dL — ABNORMAL HIGH (ref 70–99)
Glucose-Capillary: 123 mg/dL — ABNORMAL HIGH (ref 70–99)
Glucose-Capillary: 109 mg/dL — ABNORMAL HIGH (ref 70–99)

## 2021-07-19 MED ORDER — BISACODYL 10 MG RE SUPP
10.0000 mg | Freq: Every day | RECTAL | Status: DC
  Administered 2021-07-19 – 2021-07-22 (×3): 10 mg via RECTAL
  Filled 2021-07-19 (×3): qty 1

## 2021-07-19 NOTE — Progress Notes (Signed)
Physical Therapy Treatment Patient Details Name: Margaret Weber MRN: [id number] DOB: [birth year] Today's Date: 07/19/2021   History of Present Illness Berry Gallacher is a 61 y.o. female with medical history significant for COPD.  Patient was brought to the ED via EMS.  Presents with complaints of bilateral lower extremity swelling, with onset of pain today.    PT Comments    Patient presents with less swelling to legs and agreeable for therapy.  Patient demonstrates slow labored movement for sitting up at bedside with difficulty gripping/using LUE due to IV insertion, required HOB raised and Mod assist to scoot to edge of bed.  Patient demonstrates fair/good return for completing BLE ROM/strengthening exercises except ankle pumps due to pain/soreness bilateral ankles/feet and limited to a few shuffling side steps at bedside before having to sit due to fatigue and weakness.  Patient tolerated sitting up in chair after therapy - nurse aware.  Patient will benefit from continued skilled physical therapy in hospital and recommended venue below to increase strength, balance, endurance for safe ADLs and gait.       Recommendations for follow up therapy are one component of a multi-disciplinary discharge planning process, led by the attending physician.  Recommendations may be updated based on patient status, additional functional criteria and insurance authorization.  Follow Up Recommendations  Skilled nursing-short term rehab (<3 hours/day)     Assistance Recommended at Discharge Frequent or constant Supervision/Assistance  Patient can return home with the following A lot of help with walking and/or transfers;A lot of help with bathing/dressing/bathroom;Assistance with cooking/housework;Help with stairs or ramp for entrance;Assist for transportation   Equipment Recommendations  None recommended by PT    Recommendations for Other Services       Precautions / Restrictions  Precautions Precautions: Fall Restrictions Weight Bearing Restrictions: No     Mobility  Bed Mobility Overal bed mobility: Needs Assistance Bed Mobility: Supine to Sit     Supine to sit: Mod assist, HOB elevated     General bed mobility comments: slow labored movement with fair/poor return for using bed rail or scooting to edge of bed due to discomfort in left wrist due to IV insertion pain    Transfers Overall transfer level: Needs assistance Equipment used: Rolling walker (2 wheels) Transfers: Sit to/from Stand, Bed to chair/wheelchair/BSC Sit to Stand: Mod assist, Min assist   Step pivot transfers: Mod assist       General transfer comment: slow labored movement with mostly shuffling of feet due to pain/weakness    Ambulation/Gait Ambulation/Gait assistance: Mod assist, Max assist Gait Distance (Feet): 4 Feet Assistive device: Rolling walker (2 wheels) Gait Pattern/deviations: Decreased step length - right, Decreased step length - left, Decreased stance time - left, Decreased stance time - right, Decreased stride length, Shuffle Gait velocity: decreased     General Gait Details: limited to a few slow labored shuffling side steps at bedside due to increased pain in feet and poor standing balance   Stairs             Wheelchair Mobility    Modified Rankin (Stroke Patients Only)       Balance Overall balance assessment: Needs assistance Sitting-balance support: Feet supported, No upper extremity supported Sitting balance-Leahy Scale: Fair Sitting balance - Comments: fair/good seated at EOB   Standing balance support: Reliant on assistive device for balance, During functional activity, Bilateral upper extremity supported Standing balance-Leahy Scale: Poor Standing balance comment: using RW  Cognition Arousal/Alertness: Awake/alert Behavior During Therapy: WFL for tasks assessed/performed Overall Cognitive  Status: Within Functional Limits for tasks assessed                                          Exercises General Exercises - Lower Extremity Ankle Circles/Pumps: Seated, AROM, Strengthening, Both, 10 reps Long Arc Quad: Seated, AROM, Strengthening, Both, 10 reps Hip Flexion/Marching: Seated, Strengthening, AROM, Both, 10 reps    General Comments        Pertinent Vitals/Pain Pain Assessment Pain Assessment: Faces Faces Pain Scale: Hurts little more Pain Location: bilateral ankles and feet Pain Descriptors / Indicators: Sore, Grimacing, Guarding Pain Intervention(s): Limited activity within patient's tolerance, Monitored during session, Repositioned    Home Living                          Prior Function            PT Goals (current goals can now be found in the care plan section) Acute Rehab PT Goals Patient Stated Goal: Return home PT Goal Formulation: With patient Time For Goal Achievement: 07/29/21 Potential to Achieve Goals: Good Progress towards PT goals: Progressing toward goals    Frequency    Min 3X/week      PT Plan Current plan remains appropriate    Co-evaluation              AM-PAC PT "6 Clicks" Mobility   Outcome Measure  Help needed turning from your back to your side while in a flat bed without using bedrails?: A Little Help needed moving from lying on your back to sitting on the side of a flat bed without using bedrails?: A Lot Help needed moving to and from a bed to a chair (including a wheelchair)?: A Lot Help needed standing up from a chair using your arms (e.g., wheelchair or bedside chair)?: A Lot Help needed to walk in hospital room?: A Lot Help needed climbing 3-5 steps with a railing? : A Lot 6 Click Score: 13    End of Session   Activity Tolerance: Patient tolerated treatment well;Patient limited by fatigue Patient left: in chair;with call bell/phone within reach Nurse Communication: Mobility  status PT Visit Diagnosis: Unsteadiness on feet (R26.81);Other abnormalities of gait and mobility (R26.89);Muscle weakness (generalized) (M62.81);History of falling (Z91.81)     Time: 0240-9735 PT Time Calculation (min) (ACUTE ONLY): 24 min  Charges:  $Therapeutic Exercise: 8-22 mins $Therapeutic Activity: 8-22 mins                     2:12 PM, 07/19/21 Ocie Bob, MPT Physical Therapist with [facility] [phone] office [phone] mobile phone

## 2021-07-19 NOTE — TOC Progression Note (Signed)
Transition of Care [facility]) - Progression Note    Patient Details  Name: Margaret Weber MRN: [id number] Date of Birth: [birth year]  [facility] CM/SW Contact  Annice Needy, LCSW Phone Number: 07/19/2021, 11:23 AM  Clinical Narrative:    Messages left for Crystal Jagard with [facility] and [facility] regarding bed availability.    Expected Discharge Plan: Skilled Nursing Facility Barriers to Discharge: Continued Medical Work up  Expected Discharge Plan and Services Expected Discharge Plan: Skilled Nursing Facility In-house Referral: Clinical Social Work   Post Acute Care Choice: Skilled Nursing Facility Living arrangements for the past 2 months: Single Family Home                                       Social Determinants of Health (SDOH) Interventions    Readmission Risk Interventions     View : No data to display.

## 2021-07-19 NOTE — Progress Notes (Signed)
SATURATION QUALIFICATIONS: (This note is used to comply with regulatory documentation for home oxygen)  Patient Saturations on Room Air at Rest = 94%  Patient Saturations on Room Air while Ambulating = 0% Pt unable to walk  Patient Saturations on 0 Liters of oxygen while Ambulating = 0% Pt unable to walk  Please briefly explain why patient needs home oxygen:

## 2021-07-19 NOTE — Progress Notes (Signed)
At 2005 patient assisted to stand with walker and pivot to bedside commode from chair. No bowel movement at this time. Assisted patient to stand and pivot back to chair. At 2230 patient assisted to stand and pivot from chair to be put in bed.

## 2021-07-19 NOTE — Progress Notes (Signed)
PROGRESS NOTE    Margaret Weber  ZOX:[id number] DOB: September 26, 1960 DOA: 07/14/2021 PCP: Patient, No Pcp Per (Inactive)    Brief Narrative:  Margaret Weber is a 61 y.o. female with past medical history of COPD lymphedema presented to hospital with bilateral lower extremity swelling and pain.  Patient significant other-who is a [employer], had called in APS due to caregiver burnout.  Of note, as per the significant other patient has not walked in at least 3 to 4 years due to fear of falls.  Patient has been having worsening swelling of her lower extremities since 2021.  She continues to smoke at home.  Significant other is hoping patient will be able to go to rehab or nursing home on discharge.  In the ED, patient had mild tachycardia.  WBC at 14.1.  Lactate at 1.5.  X-ray of the right foot showed soft tissue swelling.  X-ray of the chest, left foot was unremarkable.  EKG showed sinus tachycardia.  Patient was given Lasix,  IV vancomycin for possible cellulitis and was admitted to hospital for further evaluation and treatment.  Assessment and Plan:  Principal Problem:   Cellulitis Active Problems:   Tobacco abuse   Self neglect   Prediabetes   COPD (chronic obstructive pulmonary disease) (HCC)   Ambulatory dysfunction   Lymphedema   Debility   * Cellulitis On the background of lymphedema. Sepsis ruled out.  On oral Keflex at this time with improvement in erythema and edema.  Blood cultures negative in 5 days. 2D echocardiogram with preserved LV function with grade 1 diastolic dysfunction.  Patient with lower extremity lymphedema, edema.  Improving.   Elevated ESR, CRP of 1.3.  Received IV Lasix during hospitalization.  Has been changed to oral torsemide from 07/18/21   COPD (chronic obstructive pulmonary disease) (HCC) Stable at this time.  Continue inhalers.   Diabetes mellitus type 2. New diagnosis.  Hemoglobin A1c of 6.7.  Diabetic coordinator on board.  Will consider metformin  on discharge.  Continue diabetic education diabetic diet.   Ambulatory dysfunction/self-neglect  self-neglect.  Totally dependent for activities of daily living.  Not ambulatory has not walked in 3 to 4 years.  Significant other has been patient's sole caregiver and reported caregiver burnout.  Physical therapy evaluation evaluation recommends  skilled nursing facility placement.   Tobacco abuse Smokes a pack to a pack and a half of cigarettes daily.  Continue nicotine patch, nicotine gums  Constipation. Continue MiraLAX twice daily dosing.  Continue Colace. Dulcolax suppository PRN, if not working might need enema.    DVT prophylaxis:   Lovenox subcu  Code Status:     Code Status: Full Code  Disposition: Skilled nursing facility as per PT recommendation.  Status is: Inpatient  Remains inpatient appropriate because: Need for rehabilitation,   Family Communication:  Spoke with the patient's significant other on the phone on 07/18/2021.  Consultants:  None  Procedures:  None  Antimicrobials:  Keflex p.o. 5/21>  Anti-infectives (From admission, onward)    Start     Dose/Rate Route Frequency Ordered Stop   07/17/21 0815  cephALEXin (KEFLEX) capsule 500 mg        500 mg Oral Every 6 hours 07/17/21 0715     07/15/21 1700  vancomycin (VANCOREADY) IVPB 1500 mg/300 mL  Status:  Discontinued        1,500 mg 150 mL/hr over 120 Minutes Intravenous Every 24 hours 07/14/21 1703 07/17/21 0715   07/14/21 1700  vancomycin (VANCOREADY) IVPB  2000 mg/400 mL        2,000 mg 200 mL/hr over 120 Minutes Intravenous  Once 07/14/21 1646 07/14/21 2024      Subjective:  Today, patient was seen and examined at bedside.  Inquiring whether she is diabetes or not though have discussed with her few times.  Denies any nausea vomiting abdominal pain.  Constipated.  Objective: Vitals:   07/18/21 0426 07/18/21 1425 07/18/21 2034 07/19/21 0538  BP: (!) 113/58 108/83 120/78 101/67  Pulse: 88 93 76  100  Resp:  20 18 19   Temp:  98.1 F (36.7 C) 97.8 F (36.6 C) 97.8 F (36.6 C)  TempSrc:  Oral    SpO2: 93% 92% 92% 92%  Weight:      Height:        Intake/Output Summary (Last 24 hours) at 07/19/2021 0932 Last data filed at 07/19/2021 0500 Gross per 24 hour  Intake 1200 ml  Output 2500 ml  Net -1300 ml   Filed Weights   07/14/21 1631  Weight: 107.5 kg    Physical Examination: Body mass index is 40.68 kg/m.   General: Morbidly obese built, not in obvious distress HENT:   No scleral pallor or icterus noted. Oral mucosa is moist.  Chest:  Clear breath sounds.  Diminished breath sounds bilaterally. No crackles or wheezes.  CVS: S1 &S2 heard. No murmur.  Regular rate and rhythm. Abdomen: Soft, nontender, nondistended.  Bowel sounds are heard.   Extremities: Lateral lower extremity erythema Cram fissuring of the skin, lymphedema, Long yellow nails, extremely dry skin.   Psych: Alert, awake and oriented, normal mood CNS:  No cranial nerve deficits.  Moves extremities. Skin: Warm and dry.  See extremities.     Data Reviewed:   CBC: Recent Labs  Lab 07/14/21 1450 07/15/21 0430 07/15/21 1210 07/17/21 1046  WBC 14.1* 14.2* 12.7* 10.3  NEUTROABS 11.6*  --   --   --   HGB 14.0 13.2 13.1 13.2  HCT 46.0 42.2 40.8 42.5  MCV 98.7 97.2 96.0 96.6  PLT 265 264 251 048    Basic Metabolic Panel: Recent Labs  Lab 07/14/21 1450 07/15/21 0430 07/16/21 0354 07/18/21 0430  NA 140 139 140 138  K 4.3 3.7 3.7 3.6  CL 103 104 106 105  CO2 28 26 25 25   GLUCOSE 120* 118* 109* 119*  BUN 11 10 9 12   CREATININE 0.64 0.61 0.60 0.68  CALCIUM 9.2 8.6* 8.5* 8.8*    Liver Function Tests: Recent Labs  Lab 07/14/21 1450  AST 21  ALT 30  ALKPHOS 87  BILITOT 0.3  PROT 8.2*  ALBUMIN 4.1     Radiology Studies: No results found.    LOS: 5 days    Flora Lipps, MD [facility] Available via Epic secure chat 7am-7pm After these hours, please refer to coverage  provider listed on [email or web address] 07/19/2021, 9:32 AM

## 2021-07-20 ENCOUNTER — Encounter (HOSPITAL_COMMUNITY): Payer: Self-pay | Admitting: Internal Medicine

## 2021-07-20 DIAGNOSIS — E11628 Type 2 diabetes mellitus with other skin complications: Secondary | ICD-10-CM

## 2021-07-20 DIAGNOSIS — L03119 Cellulitis of unspecified part of limb: Secondary | ICD-10-CM | POA: Diagnosis not present

## 2021-07-20 DIAGNOSIS — K5900 Constipation, unspecified: Secondary | ICD-10-CM

## 2021-07-20 DIAGNOSIS — I5033 Acute on chronic diastolic (congestive) heart failure: Secondary | ICD-10-CM | POA: Diagnosis not present

## 2021-07-20 DIAGNOSIS — E66813 Obesity, class 3: Secondary | ICD-10-CM

## 2021-07-20 DIAGNOSIS — R262 Difficulty in walking, not elsewhere classified: Secondary | ICD-10-CM | POA: Diagnosis not present

## 2021-07-20 HISTORY — DX: Acute on chronic diastolic (congestive) heart failure: I50.33

## 2021-07-20 LAB — GLUCOSE, CAPILLARY
Glucose-Capillary: 139 mg/dL — ABNORMAL HIGH (ref 70–99)
Glucose-Capillary: 124 mg/dL — ABNORMAL HIGH (ref 70–99)

## 2021-07-20 MED ORDER — HYDROCERIN EX CREA
TOPICAL_CREAM | Freq: Every day | CUTANEOUS | Status: DC
  Filled 2021-07-20: qty 113

## 2021-07-20 MED ORDER — FLEET ENEMA 7-19 GM/118ML RE ENEM: 1.0000 | ENEMA | Freq: Once | RECTAL | Status: DC

## 2021-07-20 MED ORDER — DIPHENHYDRAMINE HCL 25 MG PO CAPS
25.0000 mg | ORAL_CAPSULE | Freq: Once | ORAL | Status: AC
  Administered 2021-07-20: 25 mg via ORAL
  Filled 2021-07-20: qty 1

## 2021-07-20 MED ORDER — SENNA 8.6 MG PO TABS
1.0000 | ORAL_TABLET | Freq: Every day | ORAL | Status: DC
  Administered 2021-07-21: 8.6 mg via ORAL
  Filled 2021-07-20 (×2): qty 1

## 2021-07-20 NOTE — Progress Notes (Signed)
Initial Nutrition Assessment  DOCUMENTATION CODES:   Morbid obesity  INTERVENTION:  Diet education: Mediterranean Diet provided  If patient is agreeable consider outpatient referral to outpatient bariatric service and/or [place]'s [facility] Diabetes [facility].    NUTRITION DIAGNOSIS:   Food and nutrition related knowledge deficit related to limited prior education as evidenced by per patient/family report.  GOAL:   (Patient will begin to alter food selections, eating pattern for improved overall health)   MONITOR:  PO intake, Labs, Skin, Weight trends  REASON FOR ASSESSMENT:   Consult (obesity)    ASSESSMENT: Patient is an obese 61 yo female with hx of COPD, CHF, Lymphedema, self-neglect and constipation. Presents from home with cellulitis.   Dependent for care per chart review.Plans to discharge to Rehab per TOC.   Patient has limited intake 0-50% (6 of 8  meals). She doesn't like the food and says she is particular about what she eats. Diet recall: breakfast is coffee and cigarette. Lunch sandwich or salad, dinner - meat and potatoes. Coke or water to drink. Patient doesn't take any type of vitamins.   Provided and reviewed the Mediterranean Diet basics with patient. Encouraged her to consider making lifestyle changes that would promote gradual weight loss and potentially decrease adverse health risk.   Obesity is a complex, chronic medical condition that is optimally managed by a multidisciplinary care team. Weight loss is not an ideal goal for an acute inpatient hospitalization. However, if further work-up for obesity is warranted, consider outpatient referral to outpatient bariatric service and/or [place]'s Nutrition [facility] Diabetes [facility].    Medications reviewed and include: dulcolax, miralax, insulin, Nicoderm patch.      Latest Ref Rng & Units 07/18/2021    4:30 AM 07/16/2021    3:54 AM 07/15/2021    4:30 AM  BMP  Glucose 70 - 99 mg/dL  053   976   734    BUN 8 - 23 mg/dL 12   9   10     Creatinine 0.44 - 1.00 mg/dL   1.93   7.90    Sodium 135 - 145 mmol/L 138   140   139    Potassium 3.5 - 5.1 mmol/L 3.6   3.7   3.7    Chloride 98 - 111 mmol/L 105   106   104    CO2 22 - 32 mmol/L 25   25   26     Calcium 8.9 - 10.3 mg/dL 8.8   8.5   8.6        Diet Order:   Diet Order             Diet Carb Modified Fluid consistency: Thin; Room service appropriate? Yes  Diet effective now                   EDUCATION NEEDS:  Education needs have been addressed  Skin:  Skin Assessment: Reviewed RN Assessment  Last BM:  5/24 large type 5  Height:   Ht Readings from Last 1 Encounters:  07/14/21 5\' 4"  (1.626 m)    Weight:   Wt Readings from Last 1 Encounters:  07/14/21 107.5 kg    Ideal Body Weight:   55 kg   BMI:  Body mass index is 40.68 kg/m.  Estimated Nutritional Needs:   Kcal:  1400-1600  Protein:  99-110 gr  Fluid:  < 2 liters   07/16/21 MS,RD,CSG,LDN Contact: 

## 2021-07-20 NOTE — Assessment & Plan Note (Signed)
--  Body mass index is 40.68 kg/m. --dietician consult

## 2021-07-20 NOTE — Assessment & Plan Note (Signed)
--  secondary to chronic self-neglect and fear of falls --continue to work with PT --SNF planned

## 2021-07-20 NOTE — Consult Note (Addendum)
WOC Nurse Consult Note: Consult requested for topical treatment recommendations for dry itchy skin to bilat legs.  Performed remotely after review of progress notes and photo in the EMR.  Pt has severe lymphedema; no open wounds or drainage is noted, dry itching peeling skin with generalized erythremia.  Dressing procedure/placement/frequency: Topical treatment orders provided for bedside nurses to perform as follows: Apply Eucerin to bilat feet and legs Q day. Please re-consult if further assistance is needed.  Thank-you,  Julien Girt MSN, [place], [place], [place], [place]

## 2021-07-20 NOTE — TOC Progression Note (Signed)
Transition of Care [facility]) - Progression Note    Patient Details  Name: Margaret Weber MRN: [id number] Date of Birth: 02/28/1961  [facility]) CM/SW Contact  Annice Needy, LCSW Phone Number: 07/20/2021, 1:13 PM  Clinical Narrative:    [facility] accepts patient and started insurance authorization.   Expected Discharge Plan: Skilled Nursing Facility Barriers to Discharge: Continued Medical Work up  Expected Discharge Plan and Services Expected Discharge Plan: Skilled Nursing Facility In-house Referral: Clinical Social Work   Post Acute Care Choice: Skilled Nursing Facility Living arrangements for the past 2 months: Single Family Home                                       Social Determinants of Health (SDOH) Interventions    Readmission Risk Interventions     View : No data to display.

## 2021-07-20 NOTE — Progress Notes (Signed)
Physical Therapy Treatment Patient Details Name: Margaret Weber MRN: [id number] DOB: 04/15/1960 Today's Date: 07/20/2021   History of Present Illness Margaret Weber is a 61 y.o. female with medical history significant for COPD.  Patient was brought to the ED via EMS.  Presents with complaints of bilateral lower extremity swelling, with onset of pain today.    PT Comments    Patient required HOB raised during bed mobility, demonstrates improvement for scooting to EOB with Min/mod assist, fair/good tolerance for completing exercises while seated at bedside with minor c/o pain in feet, and tolerated 4 trials of sit to stands for up to 30-40 seconds of standing before having to sit due to c/o fatigue and BLE weakness.  Patient limited to a few slow labored unsteady shuffling side steps during transfer to chair and tolerated staying up in chair after therapy.  Patient will benefit from continued skilled physical therapy in hospital and recommended venue below to increase strength, balance, endurance for safe ADLs and gait.    Recommendations for follow up therapy are one component of a multi-disciplinary discharge planning process, led by the attending physician.  Recommendations may be updated based on patient status, additional functional criteria and insurance authorization.  Follow Up Recommendations  Skilled nursing-short term rehab (<3 hours/day)     Assistance Recommended at Discharge    Patient can return home with the following A lot of help with walking and/or transfers;A lot of help with bathing/dressing/bathroom;Assistance with cooking/housework;Help with stairs or ramp for entrance;Assist for transportation   Equipment Recommendations  None recommended by PT    Recommendations for Other Services       Precautions / Restrictions Precautions Precautions: Fall Restrictions Weight Bearing Restrictions: No     Mobility  Bed Mobility Overal bed mobility: Needs Assistance Bed  Mobility: Supine to Sit     Supine to sit: Mod assist, HOB elevated, Min assist     General bed mobility comments: increased time, labored movement, slight improvement for scooting to EOB    Transfers Overall transfer level: Needs assistance Equipment used: Rolling walker (2 wheels) Transfers: Sit to/from Stand, Bed to chair/wheelchair/BSC Sit to Stand: Mod assist, Min assist   Step pivot transfers: Mod assist       General transfer comment: required repeated attempts before able to transfer to chair due to BLE weakness    Ambulation/Gait Ambulation/Gait assistance: Mod assist, Max assist Gait Distance (Feet): 3 Feet Assistive device: Rolling walker (2 wheels) Gait Pattern/deviations: Decreased step length - right, Decreased step length - left, Decreased stance time - left, Decreased stance time - right, Decreased stride length, Shuffle Gait velocity: decreased     General Gait Details: very unsteady one feet with poor tolerance for taking side steps due to c/o fatigue and BLE weakness   Stairs             Wheelchair Mobility    Modified Rankin (Stroke Patients Only)       Balance Overall balance assessment: Needs assistance Sitting-balance support: Feet supported, No upper extremity supported Sitting balance-Leahy Scale: Fair Sitting balance - Comments: fair/good seated at EOB   Standing balance support: Reliant on assistive device for balance, During functional activity, Bilateral upper extremity supported Standing balance-Leahy Scale: Fair Standing balance comment: using RW                            Cognition Arousal/Alertness: Awake/alert Behavior During Therapy: WFL for tasks assessed/performed Overall Cognitive Status:  Within Functional Limits for tasks assessed                                          Exercises General Exercises - Lower Extremity Long Arc Quad: Seated, AROM, Strengthening, Both, 15 reps Hip  Flexion/Marching: Seated, Strengthening, AROM, Both, 15 reps Toe Raises: Seated, AROM, Strengthening, 10 reps, Both Heel Raises: Seated, AROM, Strengthening, Both, 10 reps    General Comments        Pertinent Vitals/Pain Pain Assessment Pain Assessment: Faces Faces Pain Scale: Hurts little more Pain Location: bilateral ankles and feet and low back Pain Descriptors / Indicators: Sore, Grimacing, Guarding Pain Intervention(s): Limited activity within patient's tolerance, Monitored during session, Repositioned    Home Living                          Prior Function            PT Goals (current goals can now be found in the care plan section) Acute Rehab PT Goals Patient Stated Goal: Return home PT Goal Formulation: With patient Time For Goal Achievement: 07/29/21 Potential to Achieve Goals: Good Progress towards PT goals: Progressing toward goals    Frequency    Min 3X/week      PT Plan Current plan remains appropriate    Co-evaluation              AM-PAC PT "6 Clicks" Mobility   Outcome Measure  Help needed turning from your back to your side while in a flat bed without using bedrails?: A Little Help needed moving from lying on your back to sitting on the side of a flat bed without using bedrails?: A Lot Help needed moving to and from a bed to a chair (including a wheelchair)?: A Lot Help needed standing up from a chair using your arms (e.g., wheelchair or bedside chair)?: A Lot Help needed to walk in hospital room?: A Lot Help needed climbing 3-5 steps with a railing? : A Lot 6 Click Score: 13    End of Session   Activity Tolerance: Patient tolerated treatment well;Patient limited by fatigue Patient left: in chair;with call bell/phone within reach Nurse Communication: Mobility status PT Visit Diagnosis: Unsteadiness on feet (R26.81);Other abnormalities of gait and mobility (R26.89);Muscle weakness (generalized) (M62.81);History of falling  (Z91.81)     Time: 1442-1510 PT Time Calculation (min) (ACUTE ONLY): 28 min  Charges:  $Therapeutic Exercise: 8-22 mins $Therapeutic Activity: 8-22 mins                     3:36 PM, 07/20/21 Ocie Bob, MPT Physical Therapist with [facility] [phone] office [phone] mobile phone

## 2021-07-20 NOTE — Assessment & Plan Note (Addendum)
--  ruled out at this time; rather secondary to untreated diastolic CHF

## 2021-07-20 NOTE — Assessment & Plan Note (Addendum)
--  no pain; had large BM recently; passing flatus, exam remains benign --continue aggressive bowel regimen to prevent recurrence.

## 2021-07-20 NOTE — Progress Notes (Signed)
Progress Note   Patient: Margaret Weber OVF:[id number] DOB: Sep 24, 1960 DOA: 07/14/2021     6 DOS: the patient was seen and examined on 07/20/2021   Brief hospital course: 61 year old woman PMH COPD, self-neglect, open APS case, no PCP or outpatient care in some time, presented from home via EMS, secondary to severe self-neglect, caregiver burnout and open APS case.  Admitted for cellulitis.  Diuresed well.  Given social situation and PT recommendations, plan for SNF.  Assessment and Plan: * Cellulitis -- A complication of chronic lower extremity edema, ichthyosis, self-neglect.  Lymphedema questioned but appears to be minor or nonexistent factor.   -- Treated initially with IV vancomycin, then transition to 2 oral antibiotics.  Cellulitis appears resolved.  Complete oral therapy 5/25.   COPD (chronic obstructive pulmonary disease) (HCC) -- Without acute exacerbation.  No hypoxia.  Continue inhalers.  Recommend smoking cessation.  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 HgbA1c 6.5 --Does not have a PCP and ahs seen a doctor in a long time --Patient states that she will not take insulin at home, will not check blood sugars at home.  --continue SSI, consider oral Rx as outpatient  Constipation --no pain; passing flatus, exam benign --aggressive bowel regimen  Obesity, Class III, BMI 40-49.9 (morbid obesity) (HCC) --Body mass index is 40.68 kg/m. --dietician consult  Ambulatory dysfunction --secondary to chronic self-neglect and fear of falls --continue to work with PT --SNF planned  Self neglect --per chart, totally dependent for ADL.  Not ambulatory in 3 to 4 years.   --Significant other has been patient's sole caregiver and reported caregiver burnout.   Tobacco abuse --1+ PPD --Nicotine patch --recommend cessation  Acute on chronic diastolic CHF (congestive heart failure) (HCC)-resolved as of 07/20/2021 --resolved with diuretics --continue torsemide on  discharge (new medication) --new diagnosis, will need PCP establishment on discharge from SNF from ongoing follow-up  Lymphedema-resolved as of 07/20/2021 --ruled out at this time; rather secondary to untreated diastolic CHF      Last stool 5/20     Subjective:  Feels fine, no pain; but still constipated Ate well this AM, delicious breakfast No abdominal pain No leg pain Legs much better, swelling down considerably  Ambulation/Gait assistance: Mod assist, Max assist Gait Distance (Feet): 4 Feet  Physical Exam: Vitals:   07/19/21 0538 07/19/21 1312 07/19/21 2153 07/20/21 0529  BP: 101/67 106/67 125/76 110/77  Pulse: 100 100 (!) 103 96  Resp: 19 20 20 18   Temp: 97.8 F (36.6 C) 97.8 F (36.6 C) 98 F (36.7 C) 98.2 F (36.8 C)  TempSrc:  Oral    SpO2: 92% 92% 94% 92%  Weight:      Height:       Physical Exam Vitals and nursing note reviewed.  Constitutional:      General: She is not in acute distress.    Appearance: She is morbidly obese. She is not ill-appearing or toxic-appearing.     Comments: Poorly groomed  Cardiovascular:     Rate and Rhythm: Regular rhythm. Tachycardia present.     Heart sounds: No murmur heard.    Comments: Telemetry ST Pulmonary:     Effort: Pulmonary effort is normal. No respiratory distress.     Breath sounds: No wheezing or rhonchi.  Abdominal:     General: Abdomen is flat. There is no distension.     Palpations: Abdomen is soft.     Tenderness: There is no abdominal tenderness.  Musculoskeletal:     Right  lower leg: No edema.     Left lower leg: No edema.  Skin:    Findings: No erythema.     Comments: Dry ichthyosis, non-tender, perfusion appears intact  Neurological:     Mental Status: She is alert.    Data Reviewed:  CBG stable No other new labs WBC back to [facility] 5/21  Family Communication: none present or requested  Disposition: Status is: Inpatient Remains inpatient appropriate because: unsafe discharge; awaiting  SNF bed, h/o self-neglect, caregiver burnout   Planned Discharge Destination: Skilled nursing facility    Time spent: 35 minutes  Author: Brendia Sacks, MD 07/20/2021 9:08 AM  For on call review [email or web address].

## 2021-07-20 NOTE — Assessment & Plan Note (Signed)
--  resolved with diuretics --continue torsemide on discharge (new medication) --new diagnosis, will need PCP establishment on discharge from SNF from ongoing follow-up

## 2021-07-21 DIAGNOSIS — R262 Difficulty in walking, not elsewhere classified: Secondary | ICD-10-CM | POA: Diagnosis not present

## 2021-07-21 DIAGNOSIS — K5900 Constipation, unspecified: Secondary | ICD-10-CM | POA: Diagnosis not present

## 2021-07-21 DIAGNOSIS — L03119 Cellulitis of unspecified part of limb: Secondary | ICD-10-CM | POA: Diagnosis not present

## 2021-07-21 DIAGNOSIS — E11628 Type 2 diabetes mellitus with other skin complications: Secondary | ICD-10-CM | POA: Diagnosis not present

## 2021-07-21 DIAGNOSIS — I5033 Acute on chronic diastolic (congestive) heart failure: Secondary | ICD-10-CM | POA: Diagnosis not present

## 2021-07-21 DIAGNOSIS — Q809 Congenital ichthyosis, unspecified: Secondary | ICD-10-CM

## 2021-07-21 LAB — GLUCOSE, CAPILLARY
Glucose-Capillary: 152 mg/dL — ABNORMAL HIGH (ref 70–99)
Glucose-Capillary: 153 mg/dL — ABNORMAL HIGH (ref 70–99)
Glucose-Capillary: 130 mg/dL — ABNORMAL HIGH (ref 70–99)

## 2021-07-21 MED ORDER — NICOTINE 21 MG/24HR TD PT24: 21.0000 mg | MEDICATED_PATCH | Freq: Every day | TRANSDERMAL | 0 refills | Status: AC

## 2021-07-21 MED ORDER — SENNA 8.6 MG PO TABS: 1.0000 | ORAL_TABLET | Freq: Every day | ORAL | Status: AC

## 2021-07-21 MED ORDER — TORSEMIDE 20 MG PO TABS: 20.0000 mg | ORAL_TABLET | Freq: Every day | ORAL | Status: AC

## 2021-07-21 MED ORDER — METFORMIN HCL 500 MG PO TABS: 500.0000 mg | ORAL_TABLET | Freq: Two times a day (BID) | ORAL | Status: AC

## 2021-07-21 MED ORDER — HYDROCERIN EX CREA: 1.0000 "application " | TOPICAL_CREAM | Freq: Every day | CUTANEOUS | 0 refills | Status: AC

## 2021-07-21 MED ORDER — PANTOPRAZOLE SODIUM 40 MG PO TBEC: 40.0000 mg | DELAYED_RELEASE_TABLET | Freq: Two times a day (BID) | ORAL | Status: AC

## 2021-07-21 MED ORDER — DIPHENHYDRAMINE HCL 25 MG PO CAPS
25.0000 mg | ORAL_CAPSULE | Freq: Four times a day (QID) | ORAL | Status: DC | PRN
  Administered 2021-07-21: 25 mg via ORAL
  Filled 2021-07-21: qty 1

## 2021-07-21 MED ORDER — NICOTINE POLACRILEX 2 MG MT GUM: 2.0000 mg | CHEWING_GUM | OROMUCOSAL | 0 refills | Status: AC | PRN

## 2021-07-21 MED ORDER — NYSTATIN 100000 UNIT/GM EX POWD: 1.0000 "application " | Freq: Three times a day (TID) | CUTANEOUS | Status: AC

## 2021-07-21 MED ORDER — POLYETHYLENE GLYCOL 3350 17 G PO PACK: 17.0000 g | PACK | Freq: Two times a day (BID) | ORAL | Status: AC

## 2021-07-21 NOTE — Discharge Summary (Addendum)
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 HgbA1c 6.5 --Does not have a PCP and has seen a doctor in a long time --Patient stated that she will not take insulin at home, will not check blood sugars at home.  --continue SSI, start metformin on discharge, will need close outpatient follow-up  Obesity, Class III, BMI 40-49.9  --Diet education: Mediterranean Diet provided by dietician --If patient is agreeable -- outpatient referral to outpatient bariatric service and/or [facility] post SNF.  Ichthyosis-like condition BLE --Apply Eucerin to bilat feet and legs daily  Tobacco abuse --recommend cessation  Acute on chronic diastolic CHF  --resolved with diuretics --continue torsemide on discharge (new medication). Follow-up BMP in 1 week. 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  Admitted for cellulitis, now resolved. Diuresed well.  Given social situation and PT recommendations, plan for SNF.  *  Cellulitis -- A complication of chronic lower extremity edema, ichthyosis-like condition, self-neglect.  Lymphedema questioned but ruled out given resolution of edema with diuresis. -- Treated initially with IV vancomycin, then transition to oral antibiotics.  Cellulitis appears resolved.  Completed oral therapy 5/25.   COPD (chronic obstructive pulmonary disease) (HCC) -- Without acute exacerbation.  No hypoxia.  Continue inhalers.  Recommend smoking cessation.  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 HgbA1c 6.5 --Does not have a PCP and has seen a doctor in a long time --Patient stated that she will not take insulin at home, will not check blood sugars at home.  --continue SSI, start metformin on discharge, will need close outpatient follow-up  Constipation --resolved with aggressive bowel regimen  Obesity, Class III, BMI 40-49.9 (morbid obesity) ([place]) --Body mass index is 40.68 kg/m. --Diet education: Mediterranean Diet provided by dietician --If patient is agreeable -- outpatient referral to outpatient bariatric service and/or [facility] post SNF.  Ambulatory dysfunction --secondary to chronic self-neglect and fear of falls --continue to work with PT --SNF    Self neglect --per chart, totally dependent for ADL.  Not ambulatory in 3 to 4 years.   --Significant other has been patient's sole caregiver and reported caregiver burnout.   Ichthyosis-like condition BLE --Apply Eucerin to bilat feet and legs daily  Tobacco abuse --1+ PPD --Nicotine patch --recommend cessation  Acute on chronic diastolic CHF (congestive heart failure) (HCC)-resolved as of 07/20/2021 --resolved with diuretics --continue torsemide on discharge (new medication). Follow-up BMP in 1 week. --new diagnosis, will need PCP establishment on discharge from SNF from ongoing follow-up  Lymphedema-resolved as of 07/20/2021 --ruled  out at this time;  rather secondary to untreated diastolic CHF       Consultants: None Procedures performed: None  Disposition: Skilled nursing facility Diet recommendation:  Discharge Diet Orders (From admission, onward)     Start     Ordered   07/21/21 0000  Diet - low sodium heart healthy        07/21/21 0942           Carb modified diet DISCHARGE MEDICATION: Allergies as of 07/21/2021   No Known Allergies      Medication List     STOP taking these medications    oxyCODONE-acetaminophen 5-325 MG tablet Commonly known as: PERCOCET/ROXICET   predniSONE 50 MG tablet Commonly known as: DELTASONE       TAKE these medications    acetaminophen 325 MG tablet Commonly known as: TYLENOL Take 650 mg by mouth every 6 (six) hours as needed.   diphenhydramine-acetaminophen 25-500 MG Tabs tablet Commonly known as: TYLENOL PM Take 2 tablets by mouth at bedtime as needed.   hydrocerin Crea Apply 1 application. topically daily. To bilateral legs and feet Start taking on: Jul 22, 2021   metFORMIN 500 MG tablet Commonly known as: Glucophage Take 1 tablet (500 mg total) by mouth 2 (two) times daily with a meal.   nicotine 21 mg/24hr patch Commonly known as: NICODERM CQ - dosed in mg/24 hours Place 1 patch (21 mg total) onto the skin daily. Start taking on: Jul 22, 2021   nicotine polacrilex 2 MG gum Commonly known as: NICORETTE Take 1 each (2 mg total) by mouth as needed for smoking cessation.   nystatin powder Commonly known as: MYCOSTATIN/NYSTOP Apply 1 application. topically 3 (three) times daily.   pantoprazole 40 MG tablet Commonly known as: PROTONIX Take 1 tablet (40 mg total) by mouth 2 (two) times daily.   polyethylene glycol 17 g packet Commonly known as: MIRALAX / GLYCOLAX Take 17 g by mouth 2 (two) times daily.   senna 8.6 MG Tabs tablet Commonly known as: SENOKOT Take 1 tablet (8.6 mg total) by mouth at bedtime.   torsemide 20 MG tablet Commonly known as:  DEMADEX Take 1 tablet (20 mg total) by mouth daily. Start taking on: Jul 22, 2021        Contact information for after-discharge care     [facility] .   Service: Skilled Nursing Contact information: [place] [phone]                    Exam from 5/25 Feels better Poor appetite, doesn't like the food Discharge Exam: Filed Weights   07/14/21 1631  Weight: 107.5 kg   Physical Exam Vitals reviewed.  Constitutional:      General: She is not in acute distress.    Appearance: She is not ill-appearing or toxic-appearing.  Cardiovascular:     Rate and Rhythm: Normal rate and regular rhythm.     Heart sounds: No murmur heard.    Comments: Telemetry SR Pulmonary:     Effort: Pulmonary effort is normal. No respiratory distress.     Breath sounds: No wheezing or rhonchi.  Skin:    Comments: BLE condition to improve; no erythema  Neurological:     Mental Status: She is alert.  Psychiatric:        Mood and Affect: Mood normal.        Behavior: Behavior normal.  Condition at discharge: good  The results of significant diagnostics from this hospitalization (including imaging, microbiology, ancillary and laboratory) are listed below for reference.   Imaging Studies: DG Tibia/Fibula Left  Result Date: 07/14/2021 CLINICAL DATA:  Rule out osteo EXAM: LEFT TIBIA AND FIBULA - 2 VIEW COMPARISON:  None Available. FINDINGS: Negative for acute fracture. Soft tissue calcification lateral to the stuffy via likely due to chronic injury. No osteomyelitis. IMPRESSION: No acute skeletal abnormality. Electronically Signed   By: Marlan Palau M.D.   On: 07/14/2021 15:26   DG Tibia/Fibula Right  Result Date: 07/14/2021 CLINICAL DATA:  Rule out osteomyelitis EXAM: RIGHT TIBIA AND FIBULA - 2 VIEW COMPARISON:  None Available. FINDINGS: Negative for fracture or osteomyelitis. Diffuse  soft tissue swelling. IMPRESSION: No acute skeletal abnormality.  Soft tissue swelling Electronically Signed   By: Marlan Palau M.D.   On: 07/14/2021 15:26   DG Chest Port 1 View  Result Date: 07/14/2021 CLINICAL DATA:  Question sepsis EXAM: PORTABLE CHEST 1 VIEW COMPARISON:  01/28/2020 FINDINGS: The heart size and mediastinal contours are within normal limits. Both lungs are clear. The visualized skeletal structures are unremarkable. IMPRESSION: No active disease. Electronically Signed   By: Marlan Palau M.D.   On: 07/14/2021 15:23   DG Foot Complete Left  Result Date: 07/14/2021 CLINICAL DATA:  Question sepsis.  Rule out osteo EXAM: LEFT FOOT - COMPLETE 3+ VIEW COMPARISON:  None Available. FINDINGS: There is no evidence of fracture or dislocation. There is no evidence of arthropathy or other focal bone abnormality. Soft tissues are unremarkable. IMPRESSION: Negative. Electronically Signed   By: Marlan Palau M.D.   On: 07/14/2021 15:24   DG Foot Complete Right  Result Date: 07/14/2021 CLINICAL DATA:  Rule out osteo EXAM: RIGHT FOOT COMPLETE - 3+ VIEW COMPARISON:  None Available. FINDINGS: Negative for fracture.  No evidence of osteomyelitis Prominent soft tissue swelling on the dorsum of the foot overlying the metatarsals. No foreign body. IMPRESSION: Negative for osteomyelitis Prominent soft tissue swelling dorsal foot. Possible hematoma or infection. Electronically Signed   By: Marlan Palau M.D.   On: 07/14/2021 15:25   ECHOCARDIOGRAM COMPLETE  Result Date: 07/15/2021    ECHOCARDIOGRAM REPORT   Patient Name:   Margaret Weber Date of Exam: 07/15/2021 Medical Rec #:  [id number]       Height:       64.0 in Accession #:    [id number]      Weight:       237.0 lb Date of Birth:  1960-12-21       BSA:          2.103 m Patient Age:    61 years        BP:           105/60 mmHg Patient Gender: F               HR:           96 bpm. [facility] Procedure: 2D Echo, Cardiac Doppler and  Color Doppler Indications:    Bilateral lower extremity edema  History:        Patient has prior history of Echocardiogram examinations, most                 recent 01/30/2020. COPD; Risk Factors:Current Smoker.  Sonographer:    Mikki Harbor Referring Phys: [phone] [facility]  Sonographer Comments: Patient is morbidly obese. IMPRESSIONS  1. Left ventricular ejection fraction, by  estimation, is 60 to 65%. The left ventricle has normal function. The left ventricle has no regional wall motion abnormalities. There is moderate concentric left ventricular hypertrophy. Left ventricular diastolic parameters are consistent with Grade I diastolic dysfunction (impaired relaxation).  2. Right ventricular systolic function is normal. The right ventricular size is normal. Tricuspid regurgitation signal is inadequate for assessing PA pressure.  3. A small pericardial effusion is present. The pericardial effusion is circumferential.  4. The mitral valve is normal in structure. No evidence of mitral valve regurgitation. No evidence of mitral stenosis.  5. The aortic valve was not well visualized. Aortic valve regurgitation is not visualized. Comparison(s): No significant change from prior study. Prior images reviewed side by side. FINDINGS  Left Ventricle: Left ventricular ejection fraction, by estimation, is 60 to 65%. The left ventricle has normal function. The left ventricle has no regional wall motion abnormalities. The left ventricular internal cavity size was normal in size. There is  moderate concentric left ventricular hypertrophy. Left ventricular diastolic parameters are consistent with Grade I diastolic dysfunction (impaired relaxation). Right Ventricle: The right ventricular size is normal. No increase in right ventricular wall thickness. Right ventricular systolic function is normal. Tricuspid regurgitation signal is inadequate for assessing PA pressure. Left Atrium: Left atrial size was normal in size. Right  Atrium: Right atrial size was normal in size. Pericardium: A small pericardial effusion is present. The pericardial effusion is circumferential. Presence of epicardial fat layer. Mitral Valve: The mitral valve is normal in structure. No evidence of mitral valve regurgitation. No evidence of mitral valve stenosis. MV peak gradient, 4.9 mmHg. The mean mitral valve gradient is 2.0 mmHg. Tricuspid Valve: The tricuspid valve is normal in structure. Tricuspid valve regurgitation is not demonstrated. No evidence of tricuspid stenosis. Aortic Valve: The aortic valve was not well visualized. Aortic valve regurgitation is not visualized. Aortic valve mean gradient measures 5.0 mmHg. Aortic valve peak gradient measures 9.6 mmHg. Aortic valve area, by VTI measures 3.08 cm. Pulmonic Valve: The pulmonic valve was normal in structure. Pulmonic valve regurgitation is not visualized. No evidence of pulmonic stenosis. Aorta: The aortic root and ascending aorta are structurally normal, with no evidence of dilitation. Venous: The inferior vena cava was not well visualized. IAS/Shunts: No atrial level shunt detected by color flow Doppler.  LEFT VENTRICLE PLAX 2D LVIDd:         4.40 cm     Diastology LVIDs:         3.20 cm     LV e' medial:    7.62 cm/s LV PW:         1.30 cm     LV E/e' medial:  13.1 LV IVS:        1.30 cm     LV e' lateral:   11.50 cm/s LVOT diam:     2.00 cm     LV E/e' lateral: 8.7 LV SV:         85 LV SV Index:   40 LVOT Area:     3.14 cm  LV Volumes (MOD) LV vol d, MOD A2C: 43.1 ml LV vol d, MOD A4C: 43.5 ml LV vol s, MOD A2C: 15.2 ml LV vol s, MOD A4C: 18.9 ml LV SV MOD A2C:     27.9 ml LV SV MOD A4C:     43.5 ml LV SV MOD BP:      26.8 ml RIGHT VENTRICLE RV Basal diam:  3.30 cm RV Mid diam:  3.20 cm RV S prime:     17.50 cm/s TAPSE (M-mode): 3.3 cm LEFT ATRIUM             Index        RIGHT ATRIUM           Index LA diam:        4.50 cm 2.14 cm/m   RA Area:     18.40 cm LA Vol (A2C):   52.8 ml 25.11 ml/m   RA Volume:   53.30 ml  25.35 ml/m LA Vol (A4C):   48.2 ml 22.92 ml/m LA Biplane Vol: 50.8 ml 24.16 ml/m  AORTIC VALVE                     PULMONIC VALVE AV Area (Vmax):    2.66 cm      PV Vmax:       0.95 m/s AV Area (Vmean):   2.41 cm      PV Peak grad:  3.6 mmHg AV Area (VTI):     3.08 cm AV Vmax:           155.00 cm/s AV Vmean:          103.000 cm/s AV VTI:            0.275 m AV Peak Grad:      9.6 mmHg AV Mean Grad:      5.0 mmHg LVOT Vmax:         131.00 cm/s LVOT Vmean:        79.100 cm/s LVOT VTI:          0.270 m LVOT/AV VTI ratio: 0.98  AORTA Ao Root diam: 3.10 cm Ao Asc diam:  3.30 cm MITRAL VALVE MV Area (PHT): 5.23 cm     SHUNTS MV Area VTI:   4.18 cm     Systemic VTI:  0.27 m MV Peak grad:  4.9 mmHg     Systemic Diam: 2.00 cm MV Mean grad:  2.0 mmHg MV Vmax:       1.11 m/s MV Vmean:      60.4 cm/s MV Decel Time: 145 msec MV E velocity: 100.00 cm/s MV A velocity: 99.00 cm/s MV E/A ratio:  1.01 Rudean Haskell MD Electronically signed by Rudean Haskell MD Signature Date/Time: 07/15/2021/3:10:17 PM    Final     Microbiology: Results for orders placed or performed during the hospital encounter of 07/14/21  Blood Culture (routine x 2)     Status: None   Collection Time: 07/14/21  2:50 PM   Specimen: BLOOD RIGHT HAND  Result Value Ref Range Status   Specimen Description BLOOD RIGHT HAND  Final   Special Requests   Final    BOTTLES DRAWN AEROBIC AND ANAEROBIC Blood Culture results may not be optimal due to an inadequate volume of blood received in culture bottles   Culture   Final    NO GROWTH 5 DAYS Performed at [facility], [address] [place], [place] [address]    Report Status 07/19/2021 FINAL  Final  Blood Culture (routine x 2)     Status: None   Collection Time: 07/14/21  2:50 PM   Specimen: BLOOD LEFT HAND  Result Value Ref Range Status   Specimen Description BLOOD LEFT HAND  Final   Special Requests   Final    BOTTLES DRAWN AEROBIC AND ANAEROBIC Blood Culture  adequate volume   Culture   Final    NO GROWTH 5  DAYS Performed at [facility], [address], [place], [place] [address]    Report Status 07/19/2021 FINAL  Final  Urine Culture     Status: Abnormal   Collection Time: 07/14/21  4:29 PM   Specimen: In/Out Cath Urine  Result Value Ref Range Status   Specimen Description   Final    IN/OUT CATH URINE Performed at [facility] [address], [place], [place] [address]    Special Requests   Final    NONE Performed at [facility], [address], [place], [place] [address]    Culture (A)  Final    20,000 COLONIES/mL DIPHTHEROIDS(CORYNEBACTERIUM SPECIES) 80,000 COLONIES/mL AEROCOCCUS SPECIES Standardized susceptibility testing for this organism is not available. Performed at [facility], [place] [address], [place], [place] [address]    Report Status 07/16/2021 FINAL  Final    Labs: CBC: Recent Labs  Lab 07/14/21 1450 07/15/21 0430 07/15/21 1210 07/17/21 1046  WBC 14.1* 14.2* 12.7* 10.3  NEUTROABS 11.6*  --   --   --   HGB 14.0 13.2 13.1 13.2  HCT 46.0 42.2 40.8 42.5  MCV 98.7 97.2 96.0 96.6  PLT 265 264 251 [id number]   Basic Metabolic Panel: Recent Labs  Lab 07/14/21 1450 07/15/21 0430 07/16/21 0354 07/18/21 0430  NA 140 139 140 138  K 4.3 3.7 3.7 3.6  CL 103 104 106 105  CO2 28 26 25 25   GLUCOSE 120* 118* 109* 119*  BUN 11 10 9 12   CREATININE 0.64 0.61 0.60 0.68  CALCIUM 9.2 8.6* 8.5* 8.8*   Liver Function Tests: Recent Labs  Lab 07/14/21 1450  AST 21  ALT 30  ALKPHOS 87  BILITOT 0.3  PROT 8.2*  ALBUMIN 4.1   CBG: Recent Labs  Lab 07/19/21 1636 07/19/21 2153 07/20/21 0740 07/20/21 2125 07/21/21 0724  GLUCAP 123* 109* 139* 124* 152*    Discharge time spent: less than 30 minutes.  Signed: Murray Hodgkins, MD [facility] 07/21/2021

## 2021-07-21 NOTE — Assessment & Plan Note (Signed)
--  Apply Eucerin to bilat feet and legs Q day.

## 2021-07-21 NOTE — Plan of Care (Signed)

## 2021-07-22 DIAGNOSIS — E1165 Type 2 diabetes mellitus with hyperglycemia: Secondary | ICD-10-CM | POA: Diagnosis not present

## 2021-07-22 DIAGNOSIS — R262 Difficulty in walking, not elsewhere classified: Secondary | ICD-10-CM | POA: Diagnosis not present

## 2021-07-22 DIAGNOSIS — E11628 Type 2 diabetes mellitus with other skin complications: Secondary | ICD-10-CM | POA: Diagnosis not present

## 2021-07-22 DIAGNOSIS — I5033 Acute on chronic diastolic (congestive) heart failure: Secondary | ICD-10-CM | POA: Diagnosis not present

## 2021-07-22 DIAGNOSIS — I87329 Chronic venous hypertension (idiopathic) with inflammation of unspecified lower extremity: Secondary | ICD-10-CM | POA: Diagnosis not present

## 2021-07-22 DIAGNOSIS — Z7689 Persons encountering health services in other specified circumstances: Secondary | ICD-10-CM | POA: Diagnosis not present

## 2021-07-22 DIAGNOSIS — I87332 Chronic venous hypertension (idiopathic) with ulcer and inflammation of left lower extremity: Secondary | ICD-10-CM | POA: Diagnosis not present

## 2021-07-22 DIAGNOSIS — J449 Chronic obstructive pulmonary disease, unspecified: Secondary | ICD-10-CM | POA: Diagnosis not present

## 2021-07-22 DIAGNOSIS — L03119 Cellulitis of unspecified part of limb: Secondary | ICD-10-CM | POA: Diagnosis not present

## 2021-07-22 DIAGNOSIS — I890 Lymphedema, not elsewhere classified: Secondary | ICD-10-CM | POA: Diagnosis not present

## 2021-07-22 DIAGNOSIS — L03115 Cellulitis of right lower limb: Secondary | ICD-10-CM | POA: Diagnosis not present

## 2021-07-22 LAB — GLUCOSE, CAPILLARY
Glucose-Capillary: 137 mg/dL — ABNORMAL HIGH (ref 70–99)
Glucose-Capillary: 254 mg/dL — ABNORMAL HIGH (ref 70–99)
Glucose-Capillary: 118 mg/dL — ABNORMAL HIGH (ref 70–99)

## 2021-07-22 MED ORDER — HYDROXYZINE HCL 25 MG PO TABS
25.0000 mg | ORAL_TABLET | Freq: Three times a day (TID) | ORAL | Status: DC | PRN
  Administered 2021-07-22: 25 mg via ORAL
  Filled 2021-07-22: qty 1

## 2021-07-22 NOTE — Progress Notes (Signed)
Report called to [facility] RN at [facility].

## 2021-07-22 NOTE — TOC Transition Note (Signed)
Transition of Care [facility]) - CM/SW Discharge Note   Patient Details  Name: Margaret Weber MRN: [id number] Date of Birth: 04/29/1960  [facility] of [facility]) CM/SW Contact:  Elliot Gault, LCSW Phone Number: 07/22/2021, 4:21 PM   Clinical Narrative:     Per Jill Side at [facility], they have received insurance auth for pt to admit. They can take her today. DC clinical sent electronically. RN called report. EMS arranged.  No other TOC needs for dc.  Final next level of care: Skilled Nursing Facility Barriers to Discharge: Barriers Resolved   Patient Goals and CMS Choice Patient states their goals for this hospitalization and ongoing recovery are:: go to SNF CMS Medicare.gov Compare Post Acute Care list provided to:: Patient Choice offered to / list presented to : Patient  Discharge Placement              Patient chooses bed at: [facility] Patient to be transferred to facility by: EMS Name of family member notified: pt only Patient and family notified of of transfer: 07/22/21  Discharge Plan and Services In-house Referral: Clinical Social Work   Post Acute Care Choice: Skilled Nursing Facility                               Social Determinants of Health (SDOH) Interventions     Readmission Risk Interventions     View : No data to display.

## 2021-07-22 NOTE — Progress Notes (Signed)
Progress Note   Patient: Margaret Weber [id number] DOB: 21-Jun-1960 DOA: 07/14/2021     8 DOS: the patient was seen and examined on 07/22/2021   Brief hospital course: 61 year old woman PMH COPD, self-neglect, open APS case, no PCP or outpatient care in some time, presented from home via EMS, secondary to severe self-neglect, caregiver burnout and open APS case.  Admitted for cellulitis.  Diuresed well.  Given social situation and PT recommendations, plan for SNF, remains medically stable from 5/24, await insurance authorization.  Assessment and Plan: * Cellulitis -- A complication of chronic lower extremity edema, ichthyosis-like skin, self-neglect.  Lymphedema questioned but rule out -- Treated initially with IV vancomycin, then transition to 2 oral antibiotics.  Cellulitis appears resolved.  Completed oral therapy 5/25.   COPD (chronic obstructive pulmonary disease) (HCC) -- Without acute exacerbation.  No hypoxia.  Continue inhalers.  Recommend smoking cessation.  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 HgbA1c 6.5 --Does not have a PCP and ahs seen a doctor in a long time --Patient stated that she will not take insulin at home, will not check blood sugars at home.  --continue SSI, oral Rx as outpatient  Constipation --no pain; had large BM recently; passing flatus, exam remains benign --continue aggressive bowel regimen to prevent recurrence.  Obesity, Class III, BMI 40-49.9 (morbid obesity) (HCC) --Body mass index is 40.68 kg/m. --Diet education: Mediterranean Diet provided --If patient is agreeable -- outpatient referral to outpatient bariatric service and/or [facility].    Ambulatory dysfunction --secondary to chronic self-neglect and fear of falls --continue to work with PT --SNF planned  Self neglect --per chart, totally dependent for ADL.  Not ambulatory in 3 to 4 years.   --Significant other has been  patient's sole caregiver and reported caregiver burnout.   Ichthyosis --Apply Eucerin to bilat feet and legs Q day.  Tobacco abuse --1+ PPD --Nicotine patch --recommend cessation  Acute on chronic diastolic CHF (congestive heart failure) (HCC)-resolved as of 07/20/2021 --resolved with diuretics --continue torsemide on discharge (new medication) --new diagnosis, will need PCP establishment on discharge from SNF from ongoing follow-up  Lymphedema-resolved as of 07/20/2021 --ruled out at this time; rather secondary to untreated diastolic CHF          Subjective:  Feels fine  Physical Exam: Vitals:   07/21/21 0551 07/21/21 1335 07/21/21 2150 07/22/21 0449  BP: 103/64 113/64 (!) 104/58 95/62  Pulse: 95 (!) 102 88 75  Resp: 19 18 18 19   Temp: 98 F (36.7 C) 97.6 F (36.4 C) 97.7 F (36.5 C) 97.8 F (36.6 C)  TempSrc:  Oral    SpO2: 92% 93% 94% 99%  Weight:      Height:       Physical Exam Vitals reviewed.  Constitutional:      General: She is not in acute distress.    Appearance: She is not ill-appearing or toxic-appearing.  Cardiovascular:     Rate and Rhythm: Normal rate and regular rhythm.     Heart sounds: No murmur heard. Pulmonary:     Effort: Pulmonary effort is normal. No respiratory distress.     Breath sounds: No wheezing, rhonchi or rales.  Skin:    Comments: Skin BLE continues to improve  Neurological:     Mental Status: She is alert.  Psychiatric:        Mood and Affect: Mood normal.        Behavior: Behavior normal.  Data Reviewed:  CBG stable  Family Communication: none present or requested  Disposition: Status is: Inpatient Remains inpatient appropriate because: awaiting SNF bed  Planned Discharge Destination: Skilled nursing facility    Time spent: 15 minutes  Author: Brendia Sacks, MD 07/22/2021 10:51 AM  For on call review [email or web address].

## 2021-07-22 NOTE — Progress Notes (Signed)
Physical Therapy Treatment Patient Details Name: Margaret Weber MRN: [id number] DOB: 1961-02-05 Today's Date: 07/22/2021   History of Present Illness Margaret Weber is a 61 y.o. female with medical history significant for COPD.  Patient was brought to the ED via EMS.  Presents with complaints of bilateral lower extremity swelling, with onset of pain today.    PT Comments    Patient stating fatigue from mobility completed earlier today and lack of sleep. She is agreeable to compelte exercises in bed. She requires assist to transition to seated EOB with assist to upright trunk, LE movement and to scoot. She demonstrates good sitting balance at EOB while completing seated exercises and is assisted back to supine at end of session. Patient will benefit from continued skilled physical therapy in hospital and recommended venue below to increase strength, balance, endurance for safe ADLs and gait.    Recommendations for follow up therapy are one component of a multi-disciplinary discharge planning process, led by the attending physician.  Recommendations may be updated based on patient status, additional functional criteria and insurance authorization.  Follow Up Recommendations  Skilled nursing-short term rehab (<3 hours/day)     Assistance Recommended at Discharge Frequent or constant Supervision/Assistance  Patient can return home with the following A lot of help with walking and/or transfers;A lot of help with bathing/dressing/bathroom;Assistance with cooking/housework;Help with stairs or ramp for entrance;Assist for transportation   Equipment Recommendations  None recommended by PT    Recommendations for Other Services       Precautions / Restrictions Precautions Precautions: Fall Restrictions Weight Bearing Restrictions: No     Mobility  Bed Mobility   Bed Mobility: Supine to Sit, Sit to Supine     Supine to sit: Mod assist, HOB elevated Sit to supine: Mod assist   General  bed mobility comments: increased time, labored movement, slight improvement for scooting to EOB    Transfers                        Ambulation/Gait                   Stairs             Wheelchair Mobility    Modified Rankin (Stroke Patients Only)       Balance Overall balance assessment: Needs assistance Sitting-balance support: Feet supported, No upper extremity supported Sitting balance-Leahy Scale: Fair Sitting balance - Comments: fair/good seated at EOB                                    Cognition Arousal/Alertness: Awake/alert Behavior During Therapy: WFL for tasks assessed/performed Overall Cognitive Status: Within Functional Limits for tasks assessed                                          Exercises General Exercises - Lower Extremity Long Arc Quad: Seated, AROM, Strengthening, Both, 15 reps Hip Flexion/Marching: Seated, Strengthening, AROM, Both, 15 reps Toe Raises: Seated, AROM, Strengthening, Both, 15 reps Heel Raises: Seated, AROM, Strengthening, Both, 15 reps    General Comments        Pertinent Vitals/Pain Pain Assessment Faces Pain Scale: Hurts little more Pain Location: bilateral ankles and feet and low back Pain Descriptors / Indicators: Sore, Grimacing, Guarding Pain Intervention(s): Limited activity  within patient's tolerance, Monitored during session, Repositioned    Home Living                          Prior Function            PT Goals (current goals can now be found in the care plan section) Acute Rehab PT Goals Patient Stated Goal: Return home PT Goal Formulation: With patient Time For Goal Achievement: 07/29/21 Potential to Achieve Goals: Good Progress towards PT goals: Progressing toward goals    Frequency    Min 3X/week      PT Plan Current plan remains appropriate    Co-evaluation              AM-PAC PT "6 Clicks" Mobility   Outcome  Measure  Help needed turning from your back to your side while in a flat bed without using bedrails?: A Little Help needed moving from lying on your back to sitting on the side of a flat bed without using bedrails?: A Lot Help needed moving to and from a bed to a chair (including a wheelchair)?: A Lot Help needed standing up from a chair using your arms (e.g., wheelchair or bedside chair)?: A Lot Help needed to walk in hospital room?: A Lot Help needed climbing 3-5 steps with a railing? : A Lot 6 Click Score: 13    End of Session   Activity Tolerance: Patient tolerated treatment well;Patient limited by fatigue Patient left: in bed;with call bell/phone within reach;with bed alarm set Nurse Communication: Mobility status PT Visit Diagnosis: Unsteadiness on feet (R26.81);Other abnormalities of gait and mobility (R26.89);Muscle weakness (generalized) (M62.81);History of falling (Z91.81)     Time: 1205-1222 PT Time Calculation (min) (ACUTE ONLY): 17 min  Charges:  $Therapeutic Exercise: 8-22 mins                     12:38 PM, 07/22/21 Mearl Latin PT, DPT Physical Therapist at [facility]

## 2021-07-26 LAB — GLUCOSE, CAPILLARY
Glucose-Capillary: 141 mg/dL — ABNORMAL HIGH (ref 70–99)
Glucose-Capillary: 194 mg/dL — ABNORMAL HIGH (ref 70–99)
Glucose-Capillary: 144 mg/dL — ABNORMAL HIGH (ref 70–99)
Glucose-Capillary: 145 mg/dL — ABNORMAL HIGH (ref 70–99)

## 2021-07-28 DIAGNOSIS — I87329 Chronic venous hypertension (idiopathic) with inflammation of unspecified lower extremity: Secondary | ICD-10-CM | POA: Diagnosis not present

## 2021-07-28 DIAGNOSIS — I5033 Acute on chronic diastolic (congestive) heart failure: Secondary | ICD-10-CM | POA: Diagnosis not present

## 2021-07-28 DIAGNOSIS — I87332 Chronic venous hypertension (idiopathic) with ulcer and inflammation of left lower extremity: Secondary | ICD-10-CM | POA: Diagnosis not present

## 2021-07-28 DIAGNOSIS — I890 Lymphedema, not elsewhere classified: Secondary | ICD-10-CM | POA: Diagnosis not present

## 2021-07-28 DIAGNOSIS — L03115 Cellulitis of right lower limb: Secondary | ICD-10-CM | POA: Diagnosis not present

## 2021-07-28 DIAGNOSIS — Z419 Encounter for procedure for purposes other than remedying health state, unspecified: Secondary | ICD-10-CM | POA: Diagnosis not present

## 2021-07-28 DIAGNOSIS — E1165 Type 2 diabetes mellitus with hyperglycemia: Secondary | ICD-10-CM | POA: Diagnosis not present

## 2021-07-28 DIAGNOSIS — J449 Chronic obstructive pulmonary disease, unspecified: Secondary | ICD-10-CM | POA: Diagnosis not present

## 2021-07-29 DIAGNOSIS — Z5181 Encounter for therapeutic drug level monitoring: Secondary | ICD-10-CM | POA: Diagnosis not present

## 2021-08-04 DIAGNOSIS — R197 Diarrhea, unspecified: Secondary | ICD-10-CM | POA: Diagnosis not present

## 2021-08-07 DIAGNOSIS — Z5181 Encounter for therapeutic drug level monitoring: Secondary | ICD-10-CM | POA: Diagnosis not present

## 2021-08-11 DIAGNOSIS — Z419 Encounter for procedure for purposes other than remedying health state, unspecified: Secondary | ICD-10-CM | POA: Diagnosis not present

## 2021-08-11 DIAGNOSIS — E1165 Type 2 diabetes mellitus with hyperglycemia: Secondary | ICD-10-CM | POA: Diagnosis not present

## 2021-08-11 DIAGNOSIS — L03115 Cellulitis of right lower limb: Secondary | ICD-10-CM | POA: Diagnosis not present

## 2021-08-11 DIAGNOSIS — I890 Lymphedema, not elsewhere classified: Secondary | ICD-10-CM | POA: Diagnosis not present

## 2021-08-11 DIAGNOSIS — I87332 Chronic venous hypertension (idiopathic) with ulcer and inflammation of left lower extremity: Secondary | ICD-10-CM | POA: Diagnosis not present

## 2021-08-11 DIAGNOSIS — I5033 Acute on chronic diastolic (congestive) heart failure: Secondary | ICD-10-CM | POA: Diagnosis not present

## 2021-08-11 DIAGNOSIS — J449 Chronic obstructive pulmonary disease, unspecified: Secondary | ICD-10-CM | POA: Diagnosis not present

## 2021-08-11 DIAGNOSIS — I87329 Chronic venous hypertension (idiopathic) with inflammation of unspecified lower extremity: Secondary | ICD-10-CM | POA: Diagnosis not present

## 2021-08-27 DIAGNOSIS — J449 Chronic obstructive pulmonary disease, unspecified: Secondary | ICD-10-CM | POA: Diagnosis not present

## 2021-08-27 DIAGNOSIS — I87332 Chronic venous hypertension (idiopathic) with ulcer and inflammation of left lower extremity: Secondary | ICD-10-CM | POA: Diagnosis not present

## 2021-08-27 DIAGNOSIS — E1165 Type 2 diabetes mellitus with hyperglycemia: Secondary | ICD-10-CM | POA: Diagnosis not present

## 2021-08-27 DIAGNOSIS — L03115 Cellulitis of right lower limb: Secondary | ICD-10-CM | POA: Diagnosis not present

## 2021-08-27 DIAGNOSIS — Z419 Encounter for procedure for purposes other than remedying health state, unspecified: Secondary | ICD-10-CM | POA: Diagnosis not present

## 2021-08-27 DIAGNOSIS — I5033 Acute on chronic diastolic (congestive) heart failure: Secondary | ICD-10-CM | POA: Diagnosis not present

## 2021-08-27 DIAGNOSIS — I87329 Chronic venous hypertension (idiopathic) with inflammation of unspecified lower extremity: Secondary | ICD-10-CM | POA: Diagnosis not present

## 2021-08-27 DIAGNOSIS — I890 Lymphedema, not elsewhere classified: Secondary | ICD-10-CM | POA: Diagnosis not present

## 2021-09-27 DIAGNOSIS — I890 Lymphedema, not elsewhere classified: Secondary | ICD-10-CM | POA: Diagnosis not present

## 2021-09-27 DIAGNOSIS — I87332 Chronic venous hypertension (idiopathic) with ulcer and inflammation of left lower extremity: Secondary | ICD-10-CM | POA: Diagnosis not present

## 2021-09-27 DIAGNOSIS — L03115 Cellulitis of right lower limb: Secondary | ICD-10-CM | POA: Diagnosis not present

## 2021-09-27 DIAGNOSIS — I5033 Acute on chronic diastolic (congestive) heart failure: Secondary | ICD-10-CM | POA: Diagnosis not present

## 2021-09-27 DIAGNOSIS — Z419 Encounter for procedure for purposes other than remedying health state, unspecified: Secondary | ICD-10-CM | POA: Diagnosis not present

## 2021-09-27 DIAGNOSIS — E1165 Type 2 diabetes mellitus with hyperglycemia: Secondary | ICD-10-CM | POA: Diagnosis not present

## 2021-09-27 DIAGNOSIS — I87329 Chronic venous hypertension (idiopathic) with inflammation of unspecified lower extremity: Secondary | ICD-10-CM | POA: Diagnosis not present

## 2021-09-27 DIAGNOSIS — J449 Chronic obstructive pulmonary disease, unspecified: Secondary | ICD-10-CM | POA: Diagnosis not present

## 2021-10-03 DIAGNOSIS — R5381 Other malaise: Secondary | ICD-10-CM | POA: Diagnosis not present

## 2021-10-03 DIAGNOSIS — J449 Chronic obstructive pulmonary disease, unspecified: Secondary | ICD-10-CM | POA: Diagnosis not present

## 2021-10-03 DIAGNOSIS — E1165 Type 2 diabetes mellitus with hyperglycemia: Secondary | ICD-10-CM | POA: Diagnosis not present

## 2021-10-03 DIAGNOSIS — K219 Gastro-esophageal reflux disease without esophagitis: Secondary | ICD-10-CM | POA: Diagnosis not present

## 2021-10-03 DIAGNOSIS — I509 Heart failure, unspecified: Secondary | ICD-10-CM | POA: Diagnosis not present

## 2021-10-03 DIAGNOSIS — I87333 Chronic venous hypertension (idiopathic) with ulcer and inflammation of bilateral lower extremity: Secondary | ICD-10-CM | POA: Diagnosis not present

## 2021-10-03 DIAGNOSIS — K5900 Constipation, unspecified: Secondary | ICD-10-CM | POA: Diagnosis not present

## 2021-10-28 DIAGNOSIS — Z419 Encounter for procedure for purposes other than remedying health state, unspecified: Secondary | ICD-10-CM | POA: Diagnosis not present

## 2021-11-03 DIAGNOSIS — J449 Chronic obstructive pulmonary disease, unspecified: Secondary | ICD-10-CM | POA: Diagnosis not present

## 2021-11-03 DIAGNOSIS — I87333 Chronic venous hypertension (idiopathic) with ulcer and inflammation of bilateral lower extremity: Secondary | ICD-10-CM | POA: Diagnosis not present

## 2021-11-14 DIAGNOSIS — Z6829 Body mass index (BMI) 29.0-29.9, adult: Secondary | ICD-10-CM | POA: Diagnosis not present

## 2021-11-14 DIAGNOSIS — I509 Heart failure, unspecified: Secondary | ICD-10-CM | POA: Diagnosis not present

## 2021-11-14 DIAGNOSIS — E119 Type 2 diabetes mellitus without complications: Secondary | ICD-10-CM | POA: Diagnosis not present

## 2021-11-14 DIAGNOSIS — Z23 Encounter for immunization: Secondary | ICD-10-CM | POA: Diagnosis not present

## 2021-11-14 DIAGNOSIS — J449 Chronic obstructive pulmonary disease, unspecified: Secondary | ICD-10-CM | POA: Diagnosis not present

## 2021-11-14 DIAGNOSIS — M6281 Muscle weakness (generalized): Secondary | ICD-10-CM | POA: Diagnosis not present

## 2021-11-14 DIAGNOSIS — E669 Obesity, unspecified: Secondary | ICD-10-CM | POA: Diagnosis not present

## 2021-11-27 DIAGNOSIS — Z419 Encounter for procedure for purposes other than remedying health state, unspecified: Secondary | ICD-10-CM | POA: Diagnosis not present

## 2021-12-03 DIAGNOSIS — I87333 Chronic venous hypertension (idiopathic) with ulcer and inflammation of bilateral lower extremity: Secondary | ICD-10-CM | POA: Diagnosis not present

## 2021-12-03 DIAGNOSIS — J449 Chronic obstructive pulmonary disease, unspecified: Secondary | ICD-10-CM | POA: Diagnosis not present

## 2021-12-28 DIAGNOSIS — Z419 Encounter for procedure for purposes other than remedying health state, unspecified: Secondary | ICD-10-CM | POA: Diagnosis not present

## 2022-01-03 DIAGNOSIS — J449 Chronic obstructive pulmonary disease, unspecified: Secondary | ICD-10-CM | POA: Diagnosis not present

## 2022-01-03 DIAGNOSIS — I87333 Chronic venous hypertension (idiopathic) with ulcer and inflammation of bilateral lower extremity: Secondary | ICD-10-CM | POA: Diagnosis not present

## 2022-01-27 DIAGNOSIS — Z419 Encounter for procedure for purposes other than remedying health state, unspecified: Secondary | ICD-10-CM | POA: Diagnosis not present

## 2022-02-02 DIAGNOSIS — I87333 Chronic venous hypertension (idiopathic) with ulcer and inflammation of bilateral lower extremity: Secondary | ICD-10-CM | POA: Diagnosis not present

## 2022-02-02 DIAGNOSIS — J449 Chronic obstructive pulmonary disease, unspecified: Secondary | ICD-10-CM | POA: Diagnosis not present

## 2022-03-05 DIAGNOSIS — J449 Chronic obstructive pulmonary disease, unspecified: Secondary | ICD-10-CM | POA: Diagnosis not present

## 2022-03-05 DIAGNOSIS — I87333 Chronic venous hypertension (idiopathic) with ulcer and inflammation of bilateral lower extremity: Secondary | ICD-10-CM | POA: Diagnosis not present

## 2022-07-25 DIAGNOSIS — J449 Chronic obstructive pulmonary disease, unspecified: Secondary | ICD-10-CM | POA: Diagnosis not present

## 2022-07-25 DIAGNOSIS — I509 Heart failure, unspecified: Secondary | ICD-10-CM | POA: Diagnosis not present

## 2022-07-25 DIAGNOSIS — E119 Type 2 diabetes mellitus without complications: Secondary | ICD-10-CM | POA: Diagnosis not present

## 2022-07-25 DIAGNOSIS — Z7689 Persons encountering health services in other specified circumstances: Secondary | ICD-10-CM | POA: Diagnosis not present

## 2022-07-25 DIAGNOSIS — E785 Hyperlipidemia, unspecified: Secondary | ICD-10-CM | POA: Diagnosis not present

## 2022-11-02 IMAGING — DX DG TIBIA/FIBULA 2V*L*
3 series · 3 of 3 positions shown · non-contrast
Comparison: None Available.

CLINICAL DATA: Rule out osteo

EXAM:
LEFT TIBIA AND FIBULA - 2 VIEW

[tibia ap]
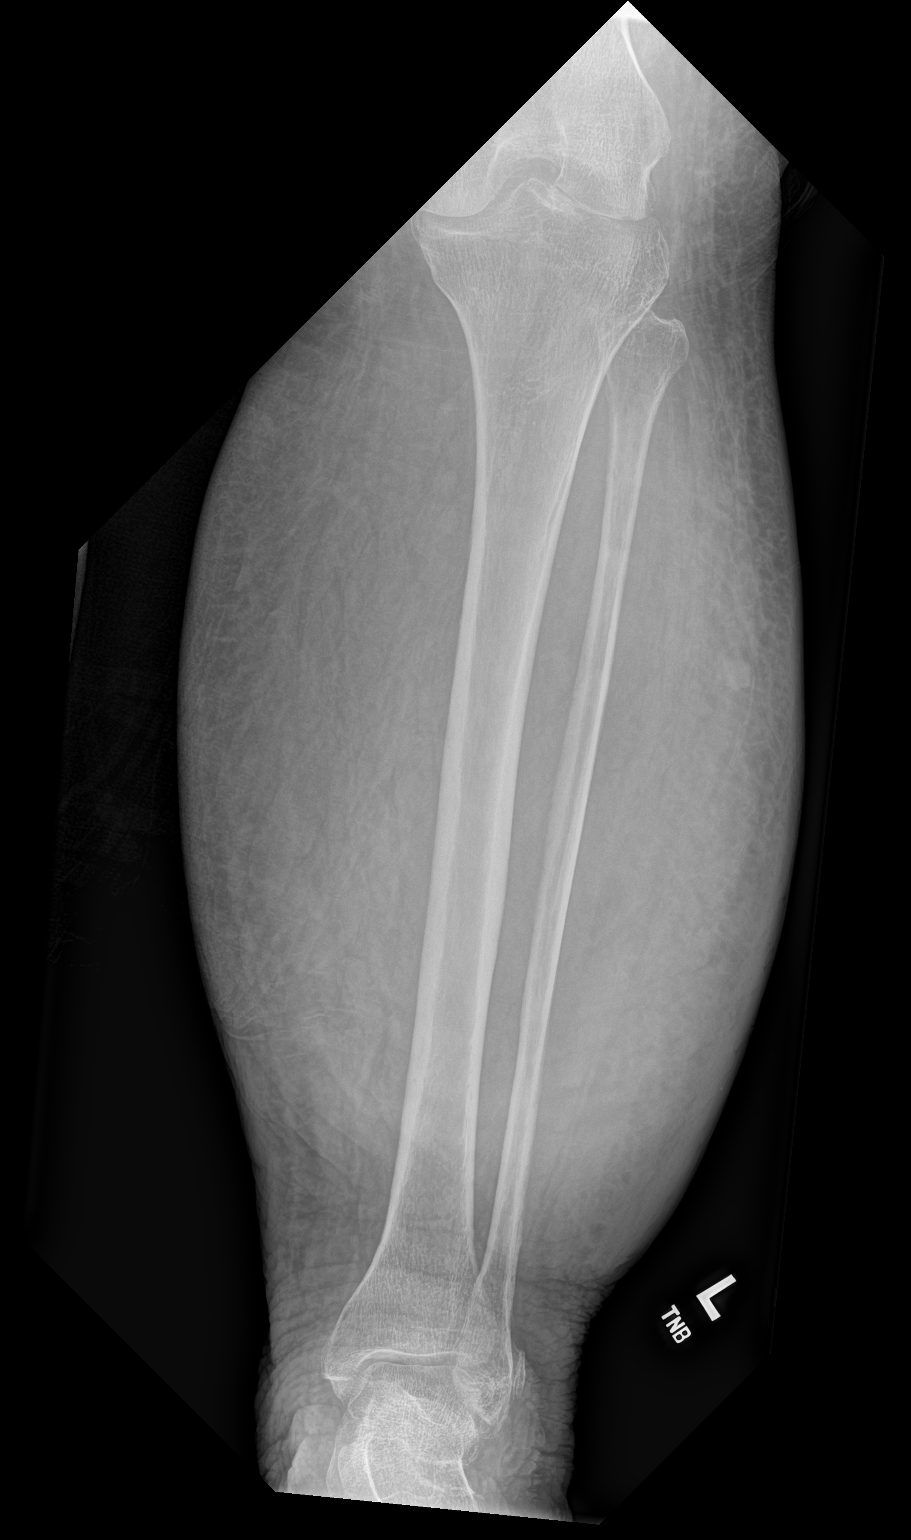

[tibia lat (1 of 2)]
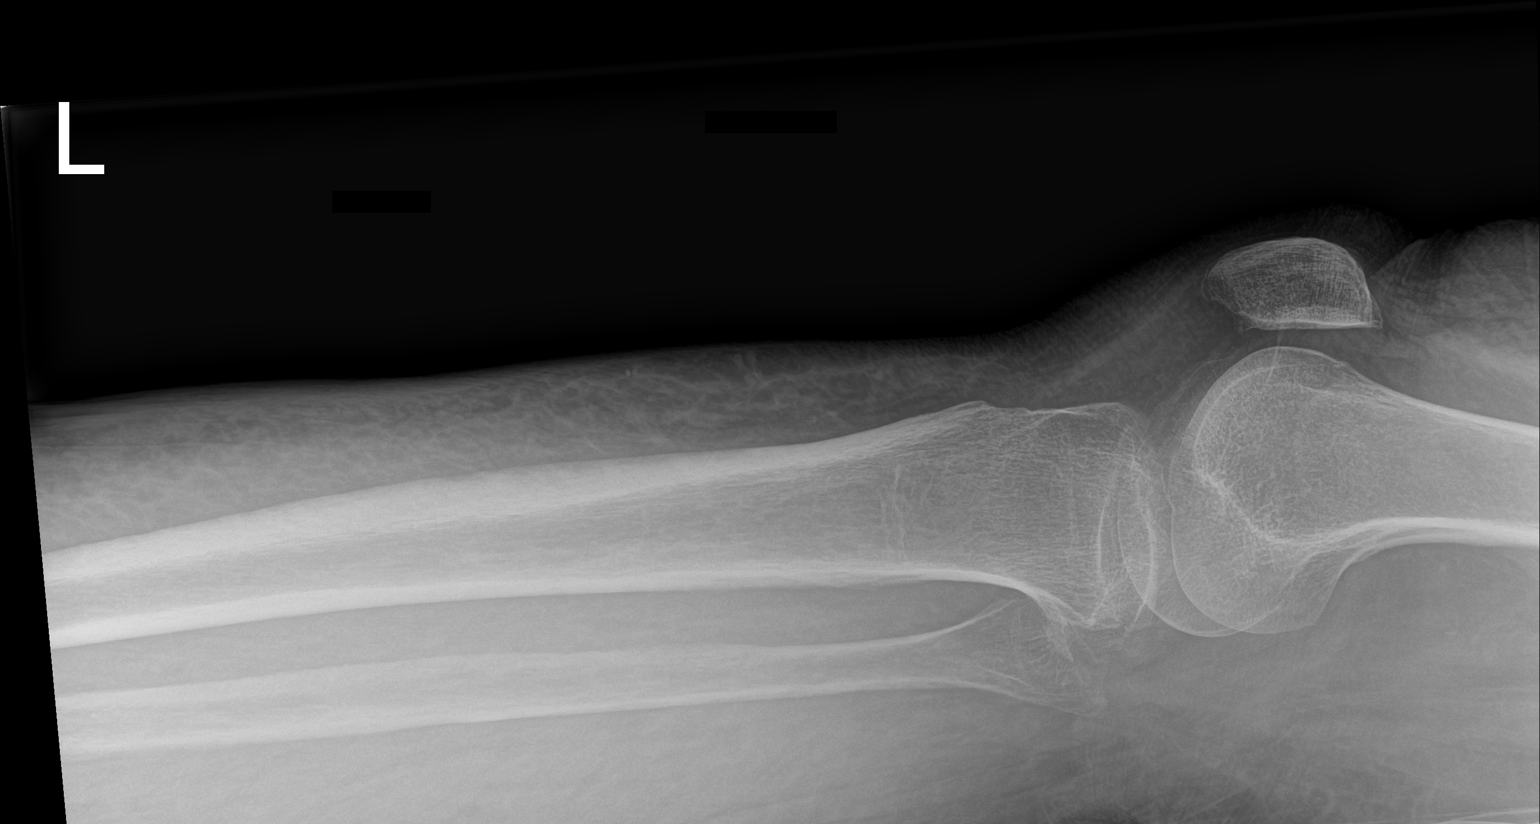

[tibia lat (2 of 2)]
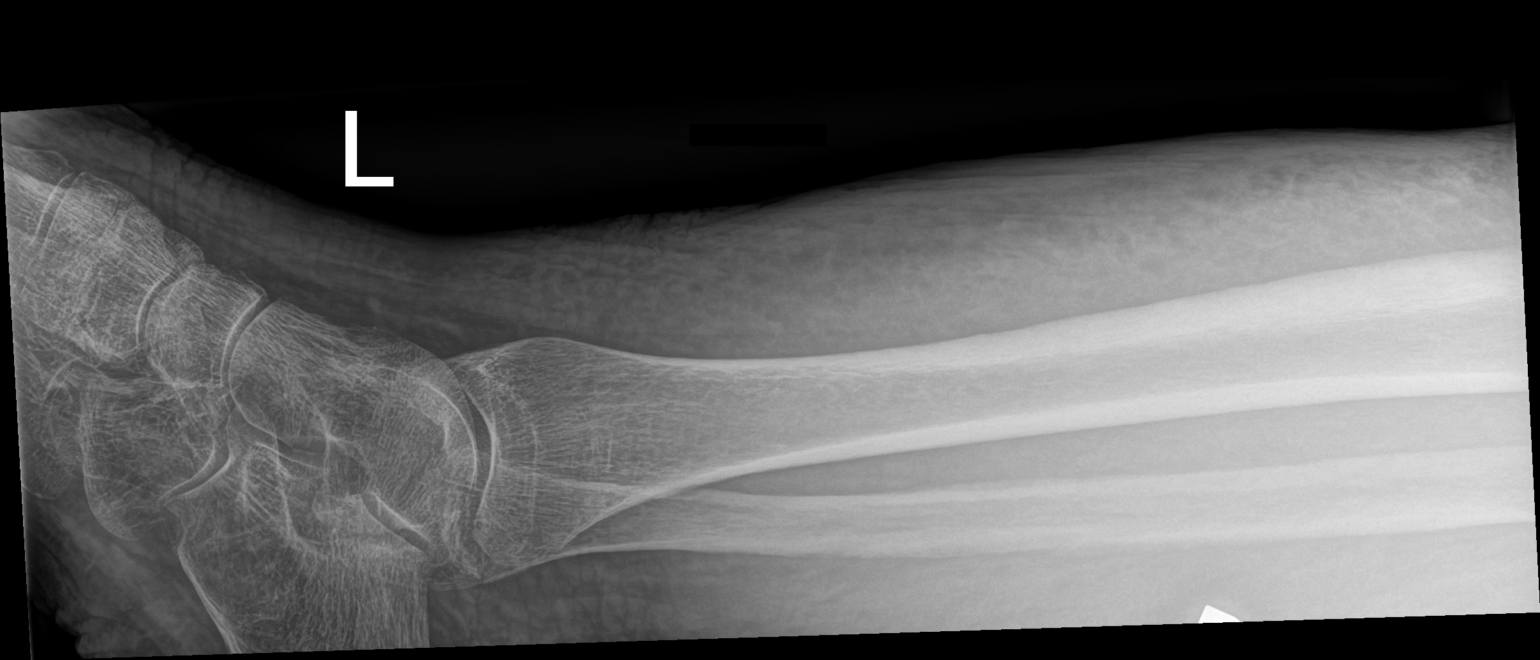

[3 of 3 positions shown; findings below may reference images not displayed]

FINDINGS: Negative for acute fracture. Soft tissue calcification lateral to
the stuffy via likely due to chronic injury. No osteomyelitis.
IMPRESSION: No acute skeletal abnormality.

## 2022-11-02 IMAGING — DX DG TIBIA/FIBULA 2V*R*
3 series · 3 of 3 positions shown · non-contrast
Comparison: None Available.

CLINICAL DATA: Rule out osteomyelitis

EXAM:
RIGHT TIBIA AND FIBULA - 2 VIEW

[tibia ap]
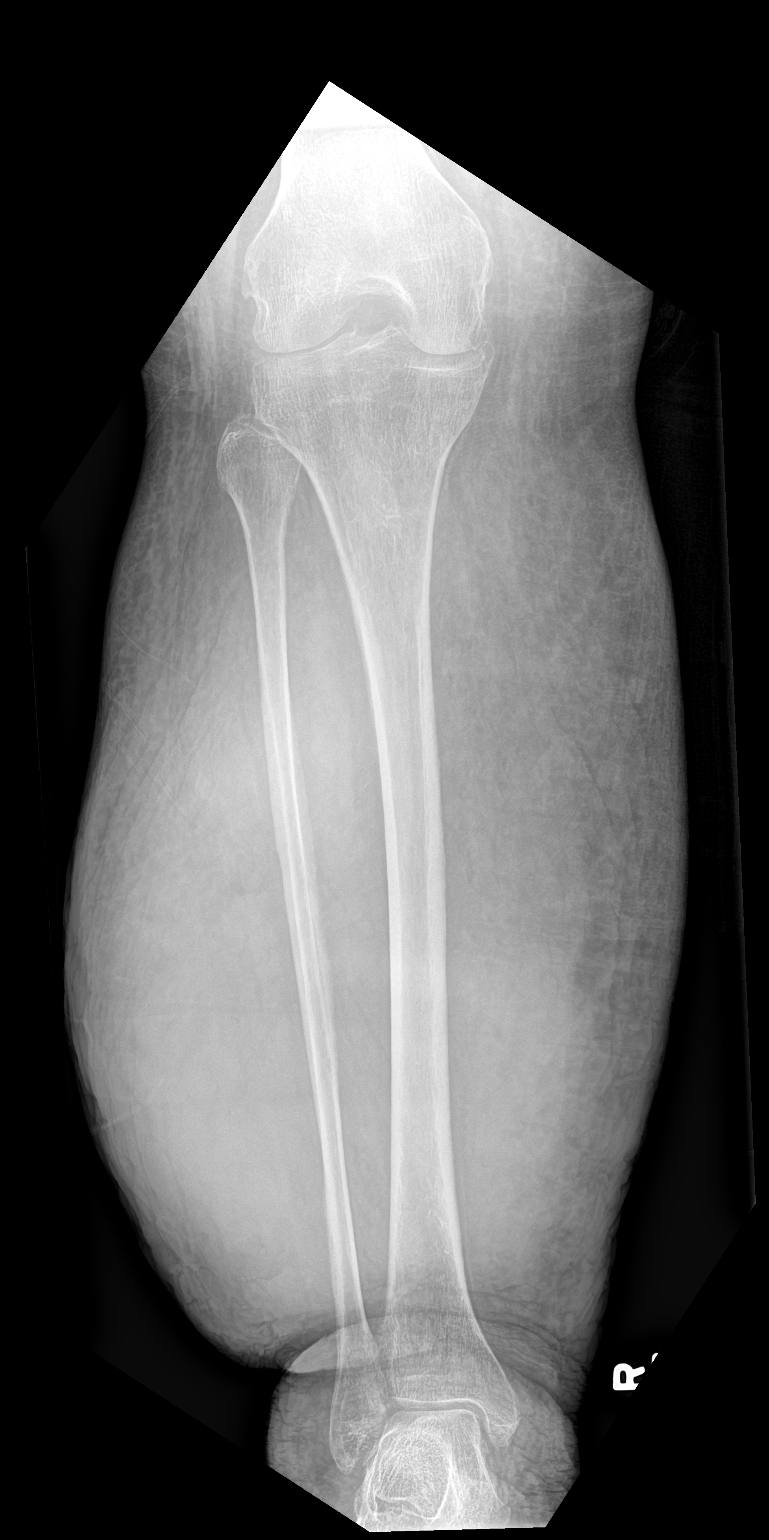

[tibia lat (1 of 2)]
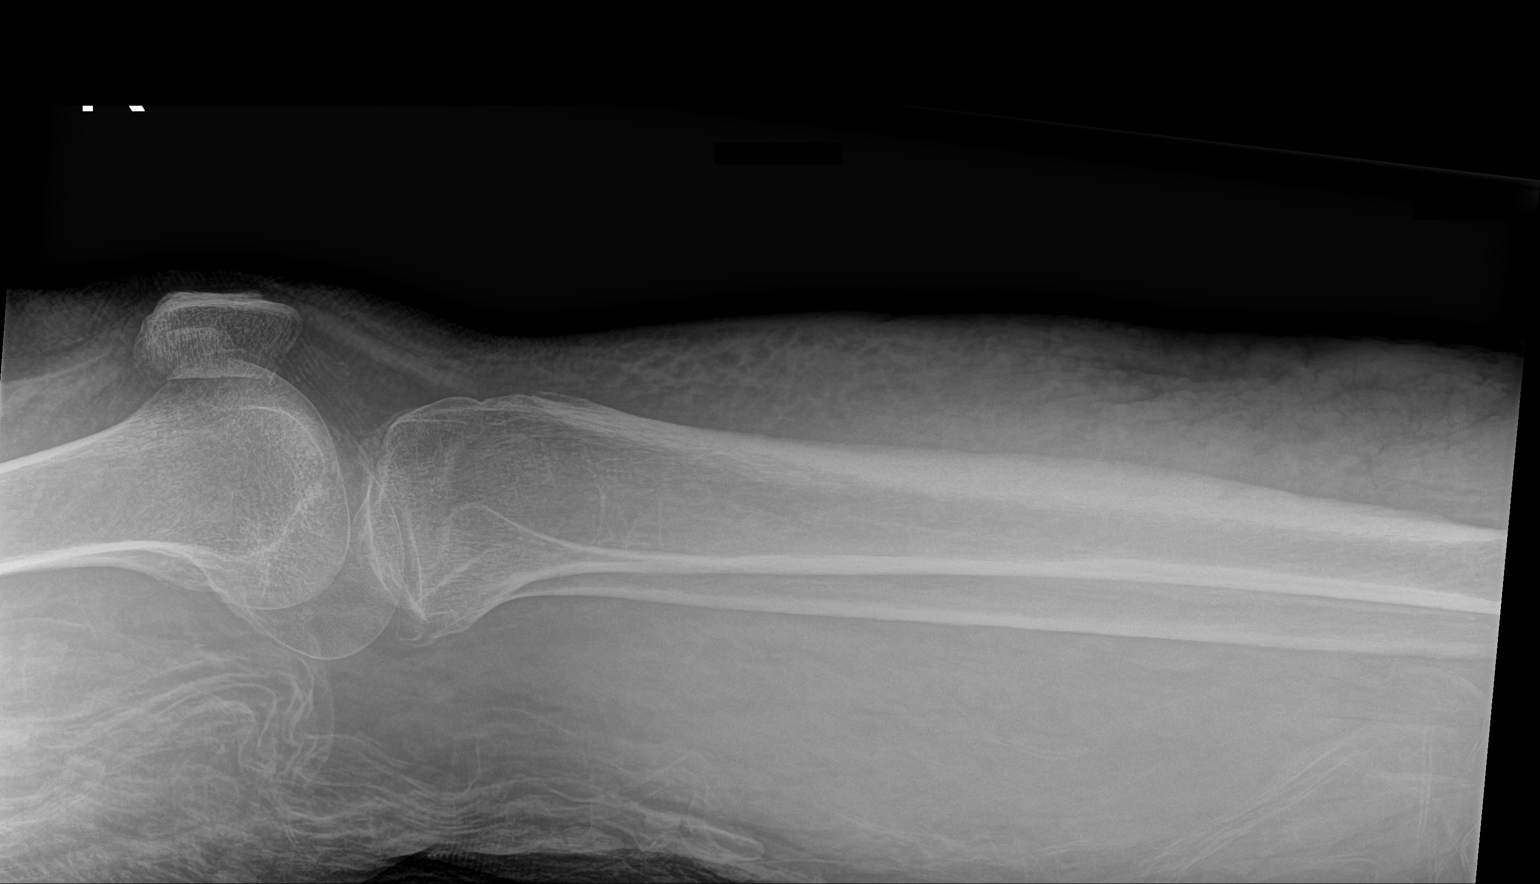

[tibia lat (2 of 2)]
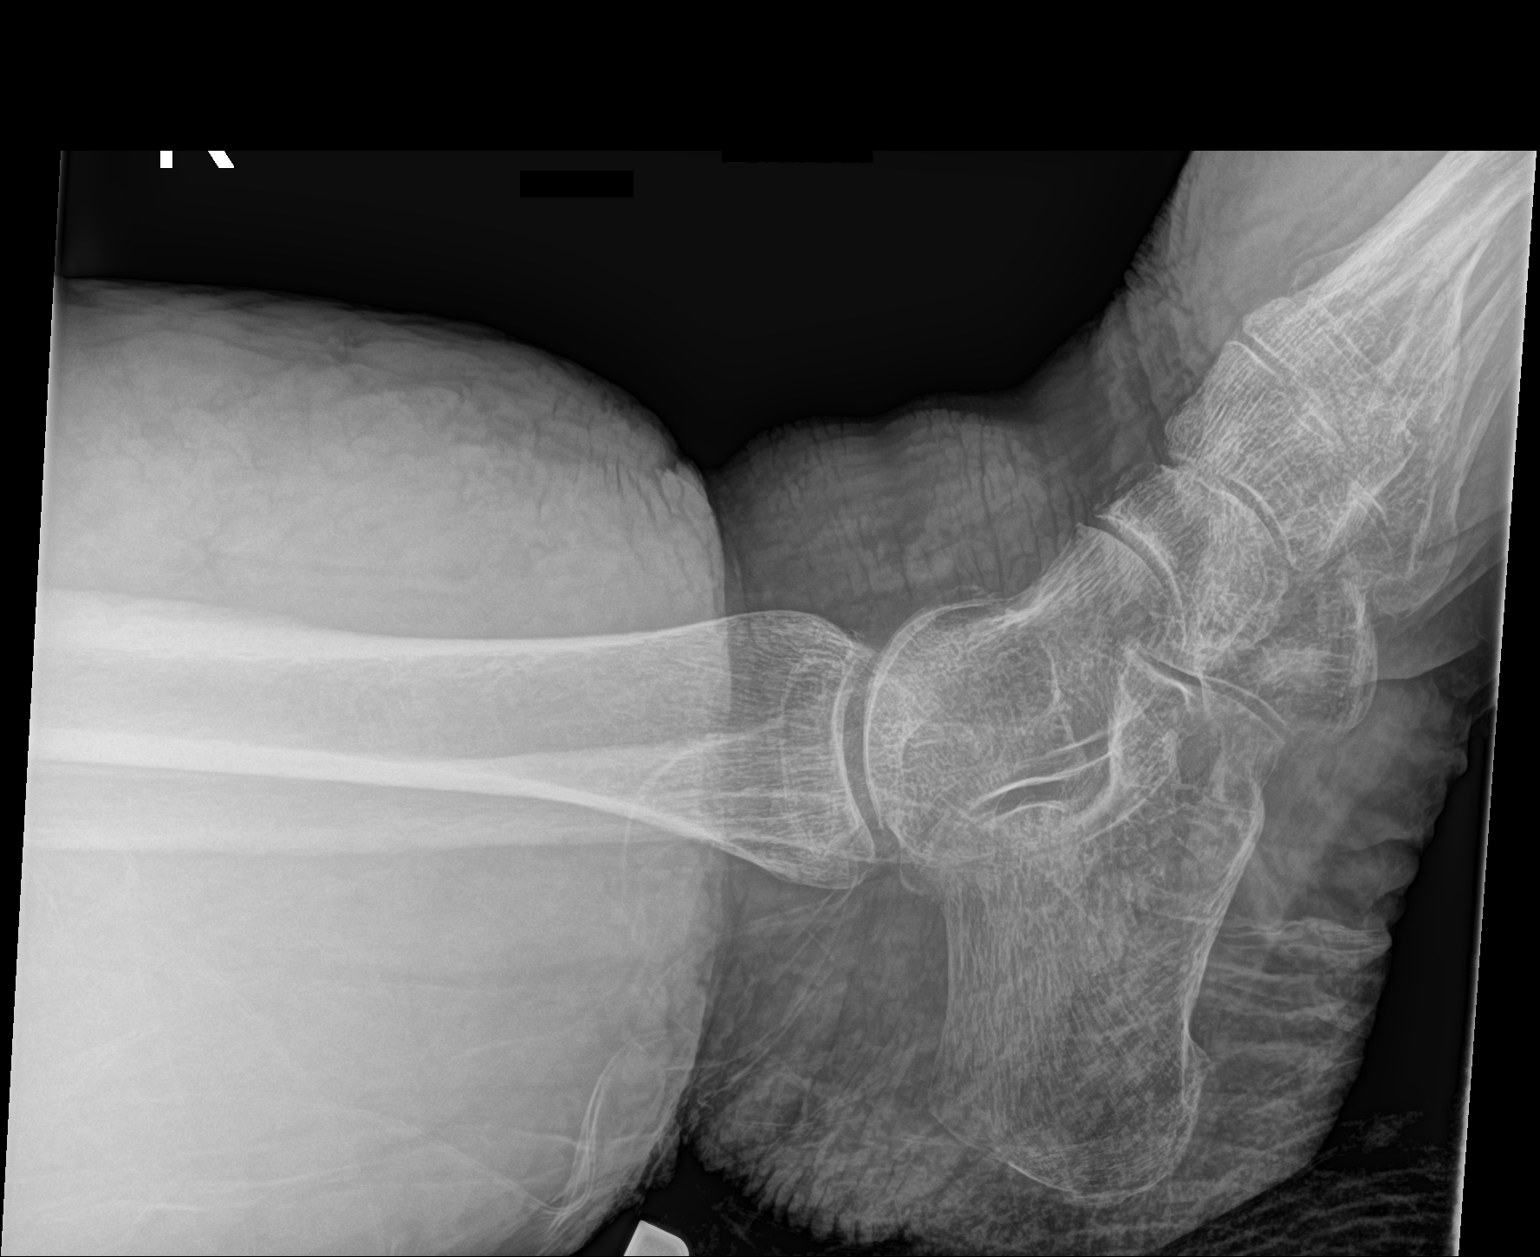

[3 of 3 positions shown; findings below may reference images not displayed]

FINDINGS: Negative for fracture or osteomyelitis. Diffuse soft tissue
swelling.
IMPRESSION: No acute skeletal abnormality.  Soft tissue swelling

## 2022-11-02 IMAGING — DX DG FOOT COMPLETE 3+V*R*
2 series · 2 of 2 positions shown · non-contrast
Comparison: None Available.

CLINICAL DATA: Rule out osteo

EXAM:
RIGHT FOOT COMPLETE - 3+ VIEW

[foot ap]
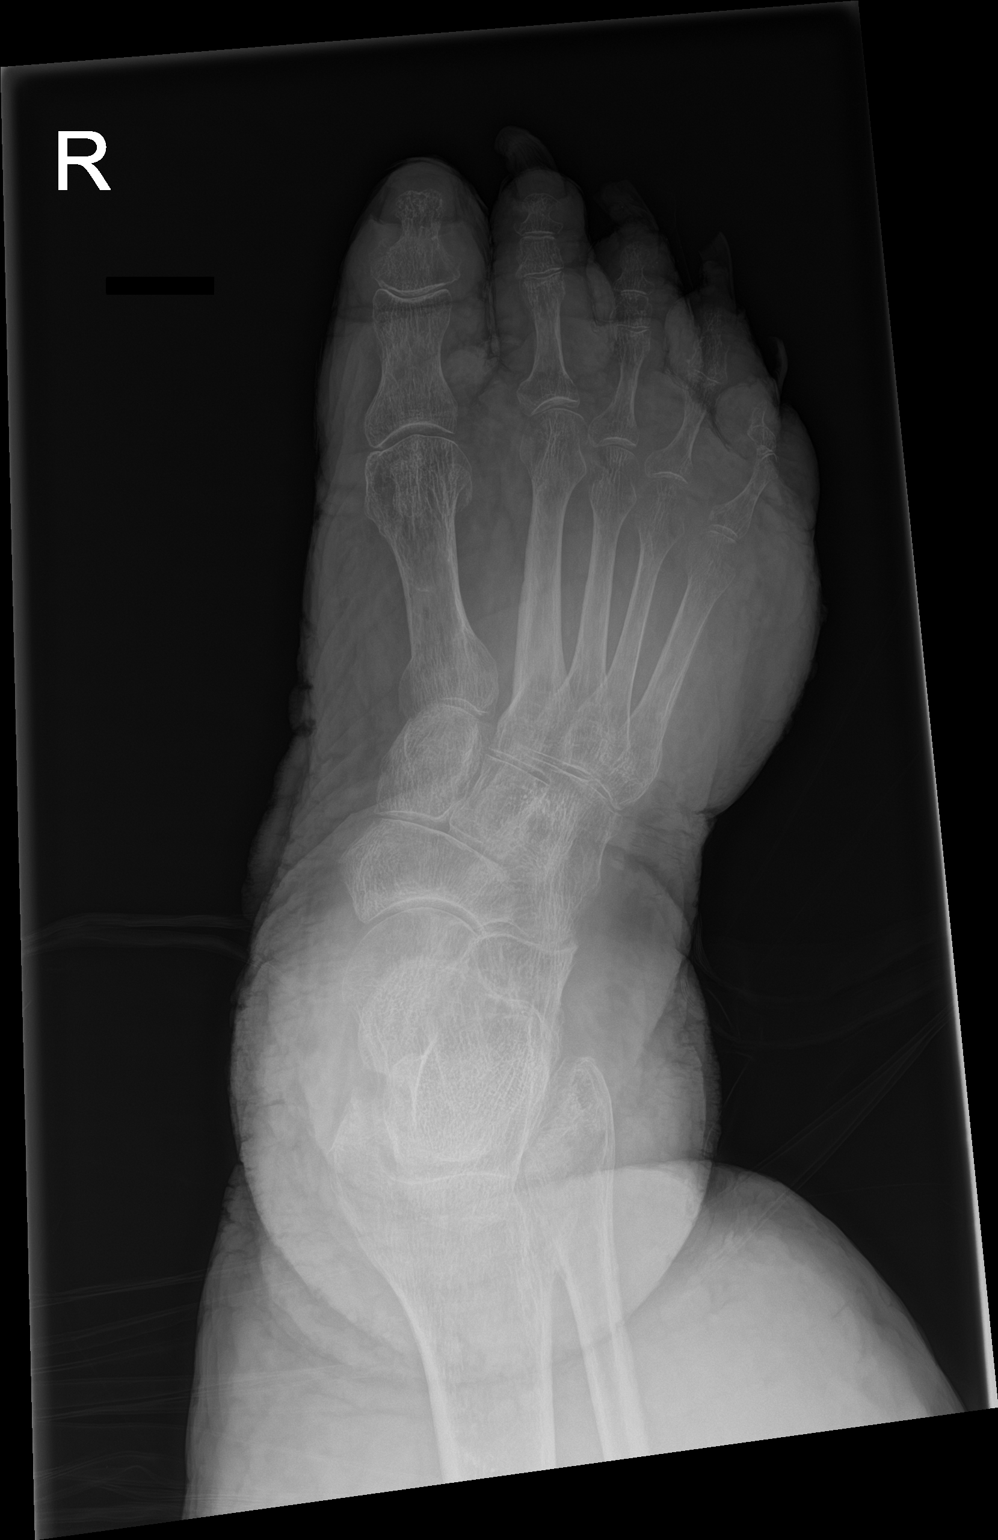

[foot lat]
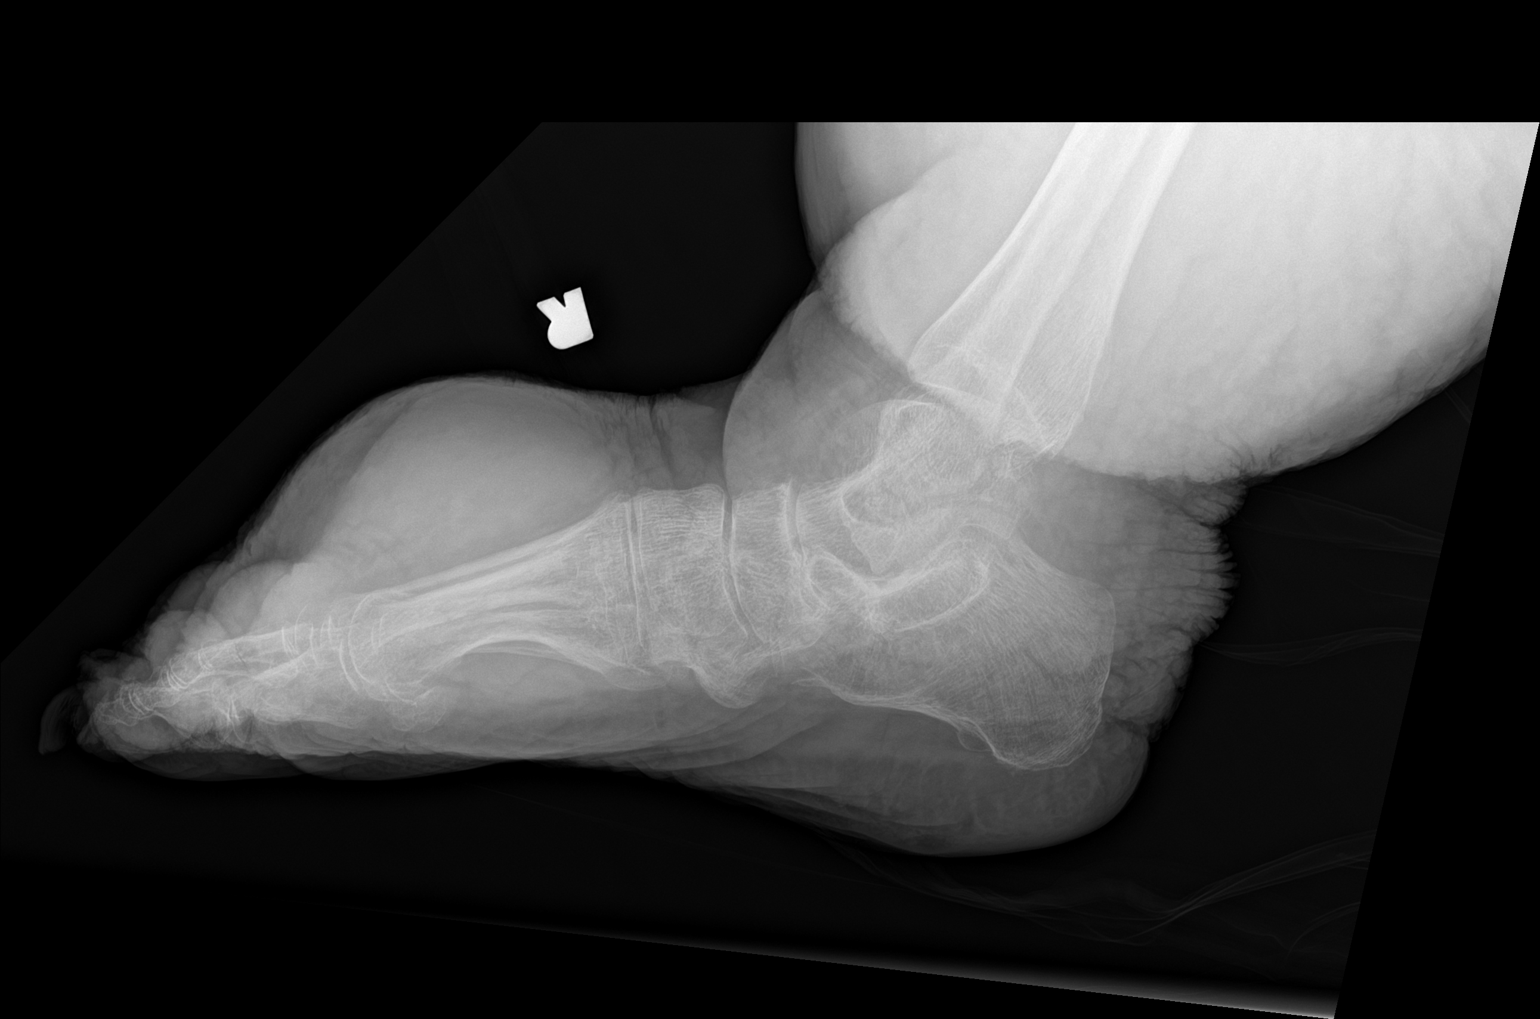

[2 of 2 positions shown; findings below may reference images not displayed]

FINDINGS: Negative for fracture.  No evidence of osteomyelitis

Prominent soft tissue swelling on the dorsum of the foot overlying
the metatarsals. No foreign body.
IMPRESSION: Negative for osteomyelitis

Prominent soft tissue swelling dorsal foot. Possible hematoma or
infection.

## 2023-06-22 ENCOUNTER — Other Ambulatory Visit (HOSPITAL_COMMUNITY): Payer: Self-pay | Admitting: Family Medicine

## 2023-06-22 DIAGNOSIS — R1012 Left upper quadrant pain: Secondary | ICD-10-CM

## 2023-06-29 ENCOUNTER — Encounter (HOSPITAL_COMMUNITY): Payer: Self-pay

## 2023-06-29 ENCOUNTER — Ambulatory Visit (HOSPITAL_COMMUNITY)

## 2023-10-17 ENCOUNTER — Other Ambulatory Visit: Payer: Self-pay

## 2023-10-17 ENCOUNTER — Emergency Department (HOSPITAL_COMMUNITY)
Admission: EM | Admit: 2023-10-17 | Discharge: 2023-10-17 | Disposition: A | Discharge status: Other Acute Inpatient Hospital | Source: Skilled Nursing Facility | Attending: Emergency Medicine | Admitting: Emergency Medicine

## 2023-10-17 DIAGNOSIS — I509 Heart failure, unspecified: Secondary | ICD-10-CM | POA: Diagnosis not present

## 2023-10-17 DIAGNOSIS — Z711 Person with feared health complaint in whom no diagnosis is made: Secondary | ICD-10-CM | POA: Insufficient documentation

## 2023-10-17 DIAGNOSIS — R45851 Suicidal ideations: Secondary | ICD-10-CM | POA: Diagnosis not present

## 2023-10-17 DIAGNOSIS — J449 Chronic obstructive pulmonary disease, unspecified: Secondary | ICD-10-CM | POA: Diagnosis not present

## 2023-10-17 MED ORDER — NICOTINE POLACRILEX 2 MG MT GUM: 2.0000 mg | CHEWING_GUM | OROMUCOSAL | Status: DC | PRN

## 2023-10-17 NOTE — ED Provider Notes (Signed)
 [place] EMERGENCY DEPARTMENT AT [facility] Provider Note   CSN: [id number] Arrival date & time: 10/17/23  1301     Patient presents with: Mobility Issues   Dannae Kato is a 63 y.o. female.  63 year old female presents to the ED via EMS from a facility with reported SI.  Patient reports that she has been having disagreements with a worker at her facility [facility] for quite a while now.  This worker is in charge of monitoring cigarette smokers at the facility.  Today she was giving her a hard time when he was trying to go smoke a cigarette and she made the comment I am so tired of this I would rather be dead than here.  Patient advised that she does not have any plan to harm herself, anyone else, she does not want to be dead, and she does not have a plan to harm herself.  Patient advises she did not think any of this would have happened with her saying that to the worker.  Patient is in the facility because she cannot walk after an injury to her back.  She is now wheelchair-bound.  Patient is not complaining of any pain, chest pain, shortness of breath, dizziness, or weakness.   Prior to Admission medications   Medication Sig Start Date End Date Taking? Authorizing Provider  acetaminophen  (TYLENOL ) 325 MG tablet Take 650 mg by mouth every 6 (six) hours as needed.    [provider]  diphenhydramine -acetaminophen  (TYLENOL  PM) 25-500 MG TABS tablet Take 2 tablets by mouth at bedtime as needed.    [provider]  hydrocerin (EUCERIN) CREA Apply 1 application. topically daily. To bilateral legs and feet 07/22/21   Jadine Toribio SQUIBB, MD  metFORMIN  (GLUCOPHAGE ) 500 MG tablet Take 1 tablet (500 mg total) by mouth 2 (two) times daily with a meal. 07/21/21 07/21/22  Jadine Toribio SQUIBB, MD  nicotine  (NICODERM CQ  - DOSED IN MG/24 HOURS) 21 mg/24hr patch Place 1 patch (21 mg total) onto the skin daily. 07/22/21   Jadine Toribio SQUIBB, MD  nicotine  polacrilex (NICORETTE )  2 MG gum Take 1 each (2 mg total) by mouth as needed for smoking cessation. 07/21/21   Jadine Toribio SQUIBB, MD  nystatin  (MYCOSTATIN /NYSTOP ) powder Apply 1 application. topically 3 (three) times daily. 07/21/21   Jadine Toribio SQUIBB, MD  pantoprazole  (PROTONIX ) 40 MG tablet Take 1 tablet (40 mg total) by mouth 2 (two) times daily. 07/21/21   Jadine Toribio SQUIBB, MD  polyethylene glycol (MIRALAX  / GLYCOLAX ) 17 g packet Take 17 g by mouth 2 (two) times daily. 07/21/21   Jadine Toribio SQUIBB, MD  senna (SENOKOT) 8.6 MG TABS tablet Take 1 tablet (8.6 mg total) by mouth at bedtime. 07/21/21   Jadine Toribio SQUIBB, MD  torsemide  (DEMADEX ) 20 MG tablet Take 1 tablet (20 mg total) by mouth daily. 07/22/21   Jadine Toribio SQUIBB, MD    Allergies: Patient has no known allergies.    Review of Systems  Psychiatric/Behavioral:  Negative for behavioral problems, confusion, self-injury and suicidal ideas.   All other systems reviewed and are negative.   Updated Vital Signs BP (!) 144/88 (BP Location: Right Arm)   Pulse 81   Temp 97.8 F (36.6 C) (Oral)   Resp 20   SpO2 100%   Physical Exam Vitals and nursing note reviewed.  Constitutional:      Appearance: Normal appearance.  HENT:     Head: Normocephalic and atraumatic.     Nose:  Nose normal.  Eyes:     Extraocular Movements: Extraocular movements intact.     Conjunctiva/sclera: Conjunctivae normal.     Pupils: Pupils are equal, round, and reactive to light.  Cardiovascular:     Rate and Rhythm: Normal rate.  Pulmonary:     Effort: Pulmonary effort is normal. No respiratory distress.     Breath sounds: Normal breath sounds.  Musculoskeletal:        General: Normal range of motion.     Cervical back: Normal range of motion.  Neurological:     General: No focal deficit present.     Mental Status: She is alert.  Psychiatric:        Mood and Affect: Mood normal.        Behavior: Behavior normal.     (all labs ordered are listed, but only abnormal  results are displayed) Labs Reviewed - No data to display  EKG: None  Radiology: No results found.  Procedures   Medications Ordered in the ED - No data to display  63 y.o. female presents to the ED for concern of Mobility Issues     This involves an extensive number of treatment options, and is a complaint that carries with it a high risk of complications and morbidity.  The differential diagnosis prior to evaluation includes, but is not limited to: Suicidal ideations, homicidal ideations  This is not an exhaustive differential.   Past Medical History / Co-morbidities / Social History: Hx of tobacco use and back injury resulting in her being wheelchair-bound Social Determinants of Health include: Tobacco use  Additional History:  Obtained by chart review.  Notably COPD, CHF, lymphedema  ED Course / Critical Interventions: Pt well-appearing on exam sitting in the ED bed in no obvious distress.  Upon initial exam patient states she does not want to harm herself, anyone else, or anyone in her life.  She denies any intrusive thoughts of SI as well.  She reports this whole situation has gotten out of hand. This appears to be a situational comment and patient is in good spirits and has no obvious signs of SI. Patient is aware of situation and responding appropriately to questions. Patient lungs clear to auscultation and no pain to palpation on abdominal exam.  Patient does not seem like she is in any acute distress and she is making jokes at bedside.  Patient reports that she does feel safe going back to her facility.  Patient was advised to return to the ED if she has SI, HI, or feels unsafe at home. Patient lives in assisted living home, is wheelchair-bound, and does not have access to guns.   I have reviewed the patients home medicines and have made adjustments as needed.  Disposition: Considered admission and after reviewing the patient's encounter today, I feel that the patient would  benefit from discharge.  Discussed course of treatment with the patient, whom demonstrated understanding.  Patient in agreement and has no further questions.    I discussed this case with my attending, Dr. Charlyn, who agreed with the proposed treatment course and cosigned this note including patient's presenting symptoms, physical exam, and planned diagnostics and interventions.  Attending physician stated agreement with plan or made changes to plan which were implemented.     This chart was dictated using voice recognition software.  Despite best efforts to proofread, errors can occur which can change the documentation meaning.    Final diagnoses:  No problem, feared complaint unfounded  ED Discharge Orders     None          Myriam Fonda GORMAN DEVONNA 10/17/23 1434    Charlyn Sora, MD 10/18/23 1029

## 2023-10-17 NOTE — ED Triage Notes (Addendum)
 SI, mobility issues going on for a couple years. VS 188/96BP 82HR [id number] 98%.   Pt denies being suicidal stating that she made a comment saying Id rather be dead then be here right now. In reference to where she was at the moment. Pt does not  have a plan to hurt herself at this time.

## 2023-10-17 NOTE — Discharge Instructions (Signed)
 Return to ED or follow up with healthcare provider if you ever have thoughts of hurting yourself or feel unsafe in living conditions.

## 2023-10-17 NOTE — ED Notes (Signed)
 Spoke to nurse Bangie at [facility] regarding pt's discharge at this time.
